# Patient Record
Sex: Female | Born: 1935 | Race: White | Hispanic: No | Smoking: Former smoker
Health system: Southern US, Community
[De-identification: ages and names within clinical notes are randomized; demographics above are authoritative.]

## PROBLEM LIST (undated history)

## (undated) DIAGNOSIS — G4730 Sleep apnea, unspecified: Secondary | ICD-10-CM

## (undated) DIAGNOSIS — E039 Hypothyroidism, unspecified: Secondary | ICD-10-CM

## (undated) DIAGNOSIS — E7800 Pure hypercholesterolemia, unspecified: Secondary | ICD-10-CM

## (undated) DIAGNOSIS — F419 Anxiety disorder, unspecified: Secondary | ICD-10-CM

## (undated) DIAGNOSIS — F32A Depression, unspecified: Secondary | ICD-10-CM

## (undated) DIAGNOSIS — C50919 Malignant neoplasm of unspecified site of unspecified female breast: Secondary | ICD-10-CM

## (undated) DIAGNOSIS — E119 Type 2 diabetes mellitus without complications: Secondary | ICD-10-CM

## (undated) DIAGNOSIS — I10 Essential (primary) hypertension: Secondary | ICD-10-CM

## (undated) DIAGNOSIS — F329 Major depressive disorder, single episode, unspecified: Secondary | ICD-10-CM

## (undated) DIAGNOSIS — M109 Gout, unspecified: Secondary | ICD-10-CM

## (undated) HISTORY — PX: EYE SURGERY: SHX253

## (undated) HISTORY — PX: BREAST SURGERY: SHX581

---

## 1998-03-21 ENCOUNTER — Emergency Department (HOSPITAL_COMMUNITY): Admission: EM | Admit: 1998-03-21 | Discharge: 1998-03-21 | Payer: Self-pay | Admitting: Emergency Medicine

## 2004-03-22 ENCOUNTER — Emergency Department: Payer: Self-pay | Admitting: Unknown Physician Specialty

## 2004-03-22 ENCOUNTER — Other Ambulatory Visit: Payer: Self-pay

## 2004-05-19 ENCOUNTER — Emergency Department: Payer: Self-pay | Admitting: General Practice

## 2004-05-19 ENCOUNTER — Other Ambulatory Visit: Payer: Self-pay

## 2004-06-22 ENCOUNTER — Inpatient Hospital Stay: Payer: Self-pay | Admitting: Unknown Physician Specialty

## 2004-06-28 ENCOUNTER — Ambulatory Visit: Payer: Self-pay | Admitting: Unknown Physician Specialty

## 2004-07-25 ENCOUNTER — Ambulatory Visit: Payer: Self-pay | Admitting: Unknown Physician Specialty

## 2004-08-25 ENCOUNTER — Ambulatory Visit: Payer: Self-pay | Admitting: Unknown Physician Specialty

## 2004-10-11 ENCOUNTER — Ambulatory Visit: Payer: Self-pay | Admitting: Internal Medicine

## 2004-10-21 ENCOUNTER — Ambulatory Visit: Payer: Self-pay | Admitting: Unknown Physician Specialty

## 2004-10-25 ENCOUNTER — Ambulatory Visit: Payer: Self-pay | Admitting: Unknown Physician Specialty

## 2004-11-25 ENCOUNTER — Ambulatory Visit: Payer: Self-pay | Admitting: Unknown Physician Specialty

## 2004-12-25 ENCOUNTER — Ambulatory Visit: Payer: Self-pay | Admitting: Unknown Physician Specialty

## 2005-01-25 ENCOUNTER — Ambulatory Visit: Payer: Self-pay | Admitting: Unknown Physician Specialty

## 2005-02-24 ENCOUNTER — Ambulatory Visit: Payer: Self-pay | Admitting: Unknown Physician Specialty

## 2005-03-27 ENCOUNTER — Ambulatory Visit: Payer: Self-pay | Admitting: Unknown Physician Specialty

## 2005-04-27 ENCOUNTER — Ambulatory Visit: Payer: Self-pay | Admitting: Unknown Physician Specialty

## 2005-05-25 ENCOUNTER — Ambulatory Visit: Payer: Self-pay | Admitting: Unknown Physician Specialty

## 2005-06-25 ENCOUNTER — Ambulatory Visit: Payer: Self-pay | Admitting: Unknown Physician Specialty

## 2005-07-25 ENCOUNTER — Ambulatory Visit: Payer: Self-pay | Admitting: Unknown Physician Specialty

## 2005-08-25 ENCOUNTER — Ambulatory Visit: Payer: Self-pay | Admitting: Unknown Physician Specialty

## 2005-09-24 ENCOUNTER — Ambulatory Visit: Payer: Self-pay | Admitting: Unknown Physician Specialty

## 2005-11-17 ENCOUNTER — Ambulatory Visit: Payer: Self-pay | Admitting: Unknown Physician Specialty

## 2005-11-25 ENCOUNTER — Ambulatory Visit: Payer: Self-pay | Admitting: Unknown Physician Specialty

## 2005-12-26 ENCOUNTER — Other Ambulatory Visit: Payer: Self-pay

## 2005-12-26 ENCOUNTER — Ambulatory Visit: Payer: Self-pay | Admitting: Obstetrics and Gynecology

## 2005-12-29 ENCOUNTER — Ambulatory Visit: Payer: Self-pay | Admitting: Unknown Physician Specialty

## 2006-01-04 ENCOUNTER — Ambulatory Visit: Payer: Self-pay | Admitting: Internal Medicine

## 2006-01-08 ENCOUNTER — Ambulatory Visit: Payer: Self-pay | Admitting: Obstetrics and Gynecology

## 2006-01-25 ENCOUNTER — Ambulatory Visit: Payer: Self-pay | Admitting: Unknown Physician Specialty

## 2006-02-24 ENCOUNTER — Ambulatory Visit: Payer: Self-pay | Admitting: Unknown Physician Specialty

## 2006-03-27 ENCOUNTER — Ambulatory Visit: Payer: Self-pay | Admitting: Unknown Physician Specialty

## 2006-04-27 ENCOUNTER — Ambulatory Visit: Payer: Self-pay | Admitting: Unknown Physician Specialty

## 2006-05-26 ENCOUNTER — Ambulatory Visit: Payer: Self-pay | Admitting: Unknown Physician Specialty

## 2006-06-26 ENCOUNTER — Ambulatory Visit: Payer: Self-pay | Admitting: Unknown Physician Specialty

## 2006-07-26 ENCOUNTER — Ambulatory Visit: Payer: Self-pay | Admitting: Unknown Physician Specialty

## 2006-08-26 ENCOUNTER — Ambulatory Visit: Payer: Self-pay | Admitting: Unknown Physician Specialty

## 2006-09-25 ENCOUNTER — Ambulatory Visit: Payer: Self-pay | Admitting: Unknown Physician Specialty

## 2006-10-26 ENCOUNTER — Ambulatory Visit: Payer: Self-pay | Admitting: Unknown Physician Specialty

## 2006-11-26 ENCOUNTER — Ambulatory Visit: Payer: Self-pay | Admitting: Unknown Physician Specialty

## 2006-12-26 ENCOUNTER — Ambulatory Visit: Payer: Self-pay | Admitting: Unknown Physician Specialty

## 2007-01-26 ENCOUNTER — Ambulatory Visit: Payer: Self-pay | Admitting: Unknown Physician Specialty

## 2007-03-29 ENCOUNTER — Ambulatory Visit: Payer: Self-pay | Admitting: Unknown Physician Specialty

## 2007-04-28 ENCOUNTER — Ambulatory Visit: Payer: Self-pay | Admitting: Unknown Physician Specialty

## 2007-05-07 ENCOUNTER — Ambulatory Visit: Payer: Self-pay | Admitting: Internal Medicine

## 2007-05-28 ENCOUNTER — Ambulatory Visit: Payer: Self-pay | Admitting: Unknown Physician Specialty

## 2007-06-26 ENCOUNTER — Ambulatory Visit: Payer: Self-pay | Admitting: Unknown Physician Specialty

## 2007-07-26 ENCOUNTER — Ambulatory Visit: Payer: Self-pay | Admitting: Unknown Physician Specialty

## 2007-08-26 ENCOUNTER — Ambulatory Visit: Payer: Self-pay | Admitting: Unknown Physician Specialty

## 2007-10-26 ENCOUNTER — Ambulatory Visit: Payer: Self-pay | Admitting: Unknown Physician Specialty

## 2008-01-26 ENCOUNTER — Ambulatory Visit: Payer: Self-pay | Admitting: Unknown Physician Specialty

## 2008-02-25 ENCOUNTER — Ambulatory Visit: Payer: Self-pay | Admitting: Unknown Physician Specialty

## 2008-03-27 ENCOUNTER — Ambulatory Visit: Payer: Self-pay | Admitting: Unknown Physician Specialty

## 2008-04-27 ENCOUNTER — Ambulatory Visit: Payer: Self-pay | Admitting: Unknown Physician Specialty

## 2008-05-25 ENCOUNTER — Ambulatory Visit: Payer: Self-pay | Admitting: Unknown Physician Specialty

## 2008-06-25 ENCOUNTER — Ambulatory Visit: Payer: Self-pay | Admitting: Unknown Physician Specialty

## 2008-08-25 ENCOUNTER — Ambulatory Visit: Payer: Self-pay | Admitting: Unknown Physician Specialty

## 2008-10-16 ENCOUNTER — Ambulatory Visit: Payer: Self-pay | Admitting: Internal Medicine

## 2008-10-23 ENCOUNTER — Ambulatory Visit: Payer: Self-pay | Admitting: Unknown Physician Specialty

## 2008-12-25 ENCOUNTER — Ambulatory Visit: Payer: Self-pay | Admitting: Unknown Physician Specialty

## 2009-02-24 ENCOUNTER — Ambulatory Visit: Payer: Self-pay | Admitting: Unknown Physician Specialty

## 2009-04-27 ENCOUNTER — Ambulatory Visit: Payer: Self-pay | Admitting: Unknown Physician Specialty

## 2009-07-27 ENCOUNTER — Ambulatory Visit: Payer: Self-pay | Admitting: Unknown Physician Specialty

## 2009-08-25 ENCOUNTER — Ambulatory Visit: Payer: Self-pay | Admitting: Unknown Physician Specialty

## 2009-10-28 ENCOUNTER — Ambulatory Visit: Payer: Self-pay | Admitting: Internal Medicine

## 2009-11-05 ENCOUNTER — Ambulatory Visit: Payer: Self-pay | Admitting: Unknown Physician Specialty

## 2009-12-27 ENCOUNTER — Ambulatory Visit: Payer: Self-pay | Admitting: Unknown Physician Specialty

## 2010-03-28 ENCOUNTER — Ambulatory Visit: Payer: Self-pay | Admitting: Unknown Physician Specialty

## 2010-04-27 ENCOUNTER — Ambulatory Visit: Payer: Self-pay | Admitting: Unknown Physician Specialty

## 2010-06-01 ENCOUNTER — Ambulatory Visit: Payer: Self-pay | Admitting: Obstetrics and Gynecology

## 2010-06-03 ENCOUNTER — Ambulatory Visit: Payer: Self-pay | Admitting: Obstetrics and Gynecology

## 2010-06-27 ENCOUNTER — Ambulatory Visit: Payer: Self-pay | Admitting: Unknown Physician Specialty

## 2010-09-26 ENCOUNTER — Ambulatory Visit: Payer: Self-pay | Admitting: Unknown Physician Specialty

## 2010-11-10 ENCOUNTER — Ambulatory Visit: Payer: Self-pay | Admitting: Internal Medicine

## 2010-11-26 ENCOUNTER — Ambulatory Visit: Payer: Self-pay | Admitting: Unknown Physician Specialty

## 2011-01-18 ENCOUNTER — Ambulatory Visit: Payer: Self-pay | Admitting: Urology

## 2011-01-20 LAB — PATHOLOGY REPORT

## 2011-02-27 ENCOUNTER — Ambulatory Visit: Payer: Self-pay | Admitting: Unknown Physician Specialty

## 2011-04-28 ENCOUNTER — Ambulatory Visit: Payer: Self-pay | Admitting: Unknown Physician Specialty

## 2011-05-26 ENCOUNTER — Ambulatory Visit: Payer: Self-pay | Admitting: Unknown Physician Specialty

## 2011-07-26 ENCOUNTER — Ambulatory Visit: Payer: Self-pay | Admitting: Unknown Physician Specialty

## 2011-10-27 ENCOUNTER — Ambulatory Visit: Payer: Self-pay | Admitting: Unknown Physician Specialty

## 2011-11-14 ENCOUNTER — Ambulatory Visit: Payer: Self-pay | Admitting: Internal Medicine

## 2011-12-26 ENCOUNTER — Ambulatory Visit: Payer: Self-pay | Admitting: Unknown Physician Specialty

## 2012-03-16 LAB — COMPREHENSIVE METABOLIC PANEL
Alkaline Phosphatase: 118 U/L (ref 50–136)
Anion Gap: 11 (ref 7–16)
BUN: 18 mg/dL (ref 7–18)
Bilirubin,Total: 0.9 mg/dL (ref 0.2–1.0)
Calcium, Total: 10 mg/dL (ref 8.5–10.1)
Chloride: 106 mmol/L (ref 98–107)
Co2: 20 mmol/L — ABNORMAL LOW (ref 21–32)
Creatinine: 0.93 mg/dL (ref 0.60–1.30)
EGFR (Non-African Amer.): 58 — ABNORMAL LOW
Osmolality: 275 (ref 275–301)
Potassium: 4 mmol/L (ref 3.5–5.1)
SGOT(AST): 38 U/L — ABNORMAL HIGH (ref 15–37)
SGPT (ALT): 32 U/L (ref 12–78)
Sodium: 137 mmol/L (ref 136–145)
Total Protein: 7.9 g/dL (ref 6.4–8.2)

## 2012-03-16 LAB — URINALYSIS, COMPLETE
Bilirubin,UR: NEGATIVE
Glucose,UR: NEGATIVE mg/dL (ref 0–75)
Nitrite: NEGATIVE
Specific Gravity: 1.019 (ref 1.003–1.030)
Squamous Epithelial: 1
WBC UR: 511 /HPF (ref 0–5)

## 2012-03-16 LAB — CBC
HCT: 46.3 % (ref 35.0–47.0)
HGB: 15.5 g/dL (ref 12.0–16.0)
MCHC: 33.4 g/dL (ref 32.0–36.0)
MCV: 99 fL (ref 80–100)
Platelet: 252 10*3/uL (ref 150–440)
RBC: 4.7 10*6/uL (ref 3.80–5.20)
WBC: 12.6 10*3/uL — ABNORMAL HIGH (ref 3.6–11.0)

## 2012-03-16 LAB — ACETAMINOPHEN LEVEL: Acetaminophen: <2 ug/mL

## 2012-03-16 LAB — ETHANOL
Ethanol %: <0.003 % (ref 0.000–0.080)
Ethanol: <3 mg/dL

## 2012-03-16 LAB — DRUG SCREEN, URINE
Amphetamines, Ur Screen: NEGATIVE (ref ?–1000)
Barbiturates, Ur Screen: NEGATIVE (ref ?–200)
Cannabinoid 50 Ng, Ur ~~LOC~~: NEGATIVE (ref ?–50)
Cocaine Metabolite,Ur ~~LOC~~: NEGATIVE (ref ?–300)
MDMA (Ecstasy)Ur Screen: POSITIVE (ref ?–500)
Opiate, Ur Screen: NEGATIVE (ref ?–300)
Phencyclidine (PCP) Ur S: NEGATIVE (ref ?–25)

## 2012-03-16 LAB — TSH: Thyroid Stimulating Horm: 2.09 u[IU]/mL

## 2012-03-17 ENCOUNTER — Inpatient Hospital Stay: Payer: Self-pay

## 2012-03-17 LAB — CBC WITH DIFFERENTIAL/PLATELET
Basophil #: 0 10*3/uL (ref 0.0–0.1)
Basophil %: 0.4 %
Eosinophil #: 0.1 10*3/uL (ref 0.0–0.7)
Eosinophil %: 1.2 %
HCT: 45 % (ref 35.0–47.0)
HGB: 14.8 g/dL (ref 12.0–16.0)
Lymphocyte #: 2.9 10*3/uL (ref 1.0–3.6)
Lymphocyte %: 26.8 %
MCH: 32.1 pg (ref 26.0–34.0)
Neutrophil #: 6.9 10*3/uL — ABNORMAL HIGH (ref 1.4–6.5)
RBC: 4.6 10*6/uL (ref 3.80–5.20)
RDW: 14.6 % — ABNORMAL HIGH (ref 11.5–14.5)
WBC: 10.7 10*3/uL (ref 3.6–11.0)

## 2012-03-17 LAB — BASIC METABOLIC PANEL
Anion Gap: 6 — ABNORMAL LOW (ref 7–16)
Calcium, Total: 9.8 mg/dL (ref 8.5–10.1)
Co2: 26 mmol/L (ref 21–32)
EGFR (Non-African Amer.): 49 — ABNORMAL LOW
Potassium: 3.6 mmol/L (ref 3.5–5.1)
Sodium: 138 mmol/L (ref 136–145)

## 2012-03-18 LAB — BASIC METABOLIC PANEL
Calcium, Total: 9.2 mg/dL (ref 8.5–10.1)
Chloride: 103 mmol/L (ref 98–107)
Osmolality: 272 (ref 275–301)
Potassium: 3.3 mmol/L — ABNORMAL LOW (ref 3.5–5.1)

## 2012-03-28 ENCOUNTER — Ambulatory Visit: Payer: Self-pay | Admitting: Psychiatry

## 2012-04-29 ENCOUNTER — Ambulatory Visit: Payer: Self-pay | Admitting: Psychiatry

## 2012-05-26 ENCOUNTER — Ambulatory Visit: Payer: Self-pay | Admitting: Psychiatry

## 2012-06-26 ENCOUNTER — Ambulatory Visit: Payer: Self-pay | Admitting: Psychiatry

## 2012-07-25 ENCOUNTER — Ambulatory Visit: Payer: Self-pay | Admitting: Psychiatry

## 2012-08-25 ENCOUNTER — Ambulatory Visit: Payer: Self-pay | Admitting: Psychiatry

## 2012-09-24 ENCOUNTER — Ambulatory Visit: Payer: Self-pay | Admitting: Psychiatry

## 2012-10-25 ENCOUNTER — Ambulatory Visit: Payer: Self-pay | Admitting: Psychiatry

## 2012-12-25 ENCOUNTER — Ambulatory Visit: Payer: Self-pay | Admitting: Psychiatry

## 2013-01-25 ENCOUNTER — Ambulatory Visit: Payer: Self-pay | Admitting: Psychiatry

## 2013-01-26 ENCOUNTER — Ambulatory Visit: Payer: Self-pay | Admitting: Psychiatry

## 2013-01-31 ENCOUNTER — Inpatient Hospital Stay: Payer: Self-pay

## 2013-01-31 ENCOUNTER — Other Ambulatory Visit: Payer: Self-pay

## 2013-01-31 LAB — URINALYSIS, COMPLETE
Bilirubin,UR: NEGATIVE
Glucose,UR: NEGATIVE mg/dL (ref 0–75)
Hyaline Cast: 20
Ph: 5 (ref 4.5–8.0)
Protein: 30
RBC,UR: 149 /HPF (ref 0–5)
Squamous Epithelial: 26
WBC UR: 243 /HPF (ref 0–5)

## 2013-01-31 LAB — CREATININE, SERUM
Creatinine: 0.97 mg/dL (ref 0.60–1.30)
EGFR (African American): >60

## 2013-01-31 LAB — PLATELET COUNT: Platelet: 273 10*3/uL (ref 150–440)

## 2013-02-01 LAB — CBC WITH DIFFERENTIAL/PLATELET
Basophil %: 0.5 %
Eosinophil #: 0.2 10*3/uL (ref 0.0–0.7)
Eosinophil %: 1.9 %
HCT: 43.3 % (ref 35.0–47.0)
HGB: 14.4 g/dL (ref 12.0–16.0)
Lymphocyte %: 33.8 %
MCHC: 33.2 g/dL (ref 32.0–36.0)
MCV: 92 fL (ref 80–100)
Monocyte #: 0.8 x10 3/mm (ref 0.2–0.9)
Neutrophil #: 6.5 10*3/uL (ref 1.4–6.5)
Neutrophil %: 56.9 %
Platelet: 255 10*3/uL (ref 150–440)
RBC: 4.73 10*6/uL (ref 3.80–5.20)
WBC: 11.3 10*3/uL — ABNORMAL HIGH (ref 3.6–11.0)

## 2013-02-01 LAB — BASIC METABOLIC PANEL
BUN: 15 mg/dL (ref 7–18)
Chloride: 105 mmol/L (ref 98–107)
Co2: 24 mmol/L (ref 21–32)
EGFR (African American): >60
EGFR (Non-African Amer.): >60
Potassium: 3.7 mmol/L (ref 3.5–5.1)
Sodium: 136 mmol/L (ref 136–145)

## 2013-02-02 LAB — URINE CULTURE

## 2013-02-02 LAB — BASIC METABOLIC PANEL
Anion Gap: 4 — ABNORMAL LOW (ref 7–16)
Chloride: 105 mmol/L (ref 98–107)
EGFR (Non-African Amer.): 58 — ABNORMAL LOW
Glucose: 84 mg/dL (ref 65–99)
Potassium: 3.8 mmol/L (ref 3.5–5.1)
Sodium: 135 mmol/L — ABNORMAL LOW (ref 136–145)

## 2013-02-02 LAB — CBC WITH DIFFERENTIAL/PLATELET
Basophil #: 0.1 10*3/uL (ref 0.0–0.1)
Eosinophil #: 0.3 10*3/uL (ref 0.0–0.7)
HCT: 39.9 % (ref 35.0–47.0)
Lymphocyte #: 2.7 10*3/uL (ref 1.0–3.6)
Lymphocyte %: 28.2 %
MCHC: 34.4 g/dL (ref 32.0–36.0)
MCV: 91 fL (ref 80–100)
Monocyte #: 0.7 x10 3/mm (ref 0.2–0.9)
Neutrophil %: 60.6 %
Platelet: 235 10*3/uL (ref 150–440)
RBC: 4.39 10*6/uL (ref 3.80–5.20)
RDW: 14.4 % (ref 11.5–14.5)
WBC: 9.5 10*3/uL (ref 3.6–11.0)

## 2013-02-04 ENCOUNTER — Inpatient Hospital Stay: Payer: Self-pay | Admitting: Psychiatry

## 2013-02-05 LAB — BEHAVIORAL MEDICINE 1 PANEL
Albumin: 3 g/dL — ABNORMAL LOW (ref 3.4–5.0)
Alkaline Phosphatase: 431 U/L — ABNORMAL HIGH (ref 50–136)
Anion Gap: 4 — ABNORMAL LOW (ref 7–16)
BUN: 12 mg/dL (ref 7–18)
Basophil %: 0.5 %
Bilirubin,Total: 0.4 mg/dL (ref 0.2–1.0)
Chloride: 106 mmol/L (ref 98–107)
Co2: 27 mmol/L (ref 21–32)
Creatinine: 0.93 mg/dL (ref 0.60–1.30)
EGFR (Non-African Amer.): 58 — ABNORMAL LOW
Eosinophil #: 0.1 10*3/uL (ref 0.0–0.7)
Eosinophil %: 1.3 %
Glucose: 96 mg/dL (ref 65–99)
Lymphocyte %: 37.1 %
MCH: 30.7 pg (ref 26.0–34.0)
MCHC: 33.7 g/dL (ref 32.0–36.0)
MCV: 91 fL (ref 80–100)
Monocyte #: 0.7 x10 3/mm (ref 0.2–0.9)
Neutrophil #: 5.2 10*3/uL (ref 1.4–6.5)
Neutrophil %: 53.4 %
Osmolality: 273 (ref 275–301)
Potassium: 4.3 mmol/L (ref 3.5–5.1)
RDW: 14.7 % — ABNORMAL HIGH (ref 11.5–14.5)
SGOT(AST): 61 U/L — ABNORMAL HIGH (ref 15–37)
Thyroid Stimulating Horm: 0.373 u[IU]/mL — ABNORMAL LOW
Total Protein: 7.2 g/dL (ref 6.4–8.2)
WBC: 9.7 10*3/uL (ref 3.6–11.0)

## 2013-02-05 LAB — CULTURE, BLOOD (SINGLE)

## 2013-02-06 LAB — CULTURE, BLOOD (SINGLE)

## 2013-02-11 LAB — URINALYSIS, COMPLETE
Blood: NEGATIVE
Glucose,UR: NEGATIVE mg/dL (ref 0–75)
Ketone: NEGATIVE
Leukocyte Esterase: NEGATIVE
Nitrite: NEGATIVE
Ph: 5 (ref 4.5–8.0)
RBC,UR: 1 /HPF (ref 0–5)
Specific Gravity: 1.025 (ref 1.003–1.030)
Squamous Epithelial: 1

## 2013-02-24 ENCOUNTER — Ambulatory Visit: Payer: Self-pay | Admitting: Psychiatry

## 2013-03-27 ENCOUNTER — Ambulatory Visit: Payer: Self-pay | Admitting: Psychiatry

## 2013-03-27 DIAGNOSIS — C50919 Malignant neoplasm of unspecified site of unspecified female breast: Secondary | ICD-10-CM

## 2013-03-27 HISTORY — DX: Malignant neoplasm of unspecified site of unspecified female breast: C50.919

## 2013-04-08 ENCOUNTER — Ambulatory Visit: Payer: Self-pay

## 2013-04-14 ENCOUNTER — Ambulatory Visit: Payer: Self-pay

## 2013-04-28 ENCOUNTER — Ambulatory Visit: Payer: Self-pay | Admitting: Psychiatry

## 2013-05-02 ENCOUNTER — Ambulatory Visit: Payer: Self-pay | Admitting: Surgery

## 2013-05-08 LAB — PATHOLOGY REPORT

## 2013-05-12 ENCOUNTER — Ambulatory Visit: Payer: Self-pay | Admitting: Surgery

## 2013-05-15 ENCOUNTER — Ambulatory Visit: Payer: Self-pay | Admitting: Oncology

## 2013-05-16 ENCOUNTER — Ambulatory Visit: Payer: Self-pay | Admitting: Surgery

## 2013-05-17 ENCOUNTER — Ambulatory Visit: Payer: Self-pay | Admitting: Oncology

## 2013-05-20 LAB — PATHOLOGY REPORT

## 2013-05-25 ENCOUNTER — Ambulatory Visit: Payer: Self-pay | Admitting: Oncology

## 2013-05-25 ENCOUNTER — Ambulatory Visit: Payer: Self-pay | Admitting: Psychiatry

## 2013-05-27 ENCOUNTER — Ambulatory Visit: Payer: Self-pay | Admitting: Oncology

## 2013-06-16 DIAGNOSIS — E039 Hypothyroidism, unspecified: Secondary | ICD-10-CM | POA: Insufficient documentation

## 2013-06-16 DIAGNOSIS — E785 Hyperlipidemia, unspecified: Secondary | ICD-10-CM | POA: Insufficient documentation

## 2013-06-16 DIAGNOSIS — I10 Essential (primary) hypertension: Secondary | ICD-10-CM | POA: Insufficient documentation

## 2013-06-16 DIAGNOSIS — E119 Type 2 diabetes mellitus without complications: Secondary | ICD-10-CM | POA: Insufficient documentation

## 2013-06-25 ENCOUNTER — Ambulatory Visit: Payer: Self-pay | Admitting: Psychiatry

## 2013-06-25 ENCOUNTER — Ambulatory Visit: Payer: Self-pay | Admitting: Oncology

## 2013-06-30 LAB — CBC CANCER CENTER
BASOS ABS: 0.2 x10 3/mm — AB (ref 0.0–0.1)
Basophil %: 1.4 %
EOS PCT: 2.1 %
Eosinophil #: 0.3 x10 3/mm (ref 0.0–0.7)
HCT: 46.4 % (ref 35.0–47.0)
HGB: 14.5 g/dL (ref 12.0–16.0)
Lymphocyte #: 5.4 x10 3/mm — ABNORMAL HIGH (ref 1.0–3.6)
Lymphocyte %: 37.2 %
MCH: 29 pg (ref 26.0–34.0)
MCHC: 31.4 g/dL — AB (ref 32.0–36.0)
MCV: 93 fL (ref 80–100)
Monocyte #: 1 x10 3/mm — ABNORMAL HIGH (ref 0.2–0.9)
Monocyte %: 7.1 %
Neutrophil #: 7.6 x10 3/mm — ABNORMAL HIGH (ref 1.4–6.5)
Neutrophil %: 52.2 %
PLATELETS: 245 x10 3/mm (ref 150–440)
RBC: 5.01 10*6/uL (ref 3.80–5.20)
RDW: 15.1 % — ABNORMAL HIGH (ref 11.5–14.5)
WBC: 14.5 x10 3/mm — AB (ref 3.6–11.0)

## 2013-07-07 LAB — CBC CANCER CENTER
BASOS ABS: 0.1 x10 3/mm (ref 0.0–0.1)
BASOS PCT: 0.7 %
EOS ABS: 0.2 x10 3/mm (ref 0.0–0.7)
Eosinophil %: 1.7 %
HCT: 44.3 % (ref 35.0–47.0)
HGB: 13.9 g/dL (ref 12.0–16.0)
Lymphocyte #: 3.4 x10 3/mm (ref 1.0–3.6)
Lymphocyte %: 30.3 %
MCH: 29 pg (ref 26.0–34.0)
MCHC: 31.4 g/dL — ABNORMAL LOW (ref 32.0–36.0)
MCV: 92 fL (ref 80–100)
Monocyte #: 0.8 x10 3/mm (ref 0.2–0.9)
Monocyte %: 7.4 %
Neutrophil #: 6.8 x10 3/mm — ABNORMAL HIGH (ref 1.4–6.5)
Neutrophil %: 59.9 %
PLATELETS: 215 x10 3/mm (ref 150–440)
RBC: 4.8 10*6/uL (ref 3.80–5.20)
RDW: 14.9 % — AB (ref 11.5–14.5)
WBC: 11.3 x10 3/mm — AB (ref 3.6–11.0)

## 2013-07-25 ENCOUNTER — Ambulatory Visit: Payer: Self-pay | Admitting: Oncology

## 2013-08-01 ENCOUNTER — Ambulatory Visit: Payer: Self-pay | Admitting: Psychiatry

## 2013-08-29 ENCOUNTER — Ambulatory Visit: Payer: Self-pay | Admitting: Oncology

## 2013-09-01 ENCOUNTER — Ambulatory Visit: Payer: Self-pay | Admitting: Oncology

## 2013-09-01 LAB — COMPREHENSIVE METABOLIC PANEL
ALK PHOS: 129 U/L — AB
AST: 16 U/L (ref 15–37)
Albumin: 3.4 g/dL (ref 3.4–5.0)
Anion Gap: 12 (ref 7–16)
BUN: 16 mg/dL (ref 7–18)
Bilirubin,Total: 0.3 mg/dL (ref 0.2–1.0)
CALCIUM: 10.1 mg/dL (ref 8.5–10.1)
CHLORIDE: 100 mmol/L (ref 98–107)
Co2: 25 mmol/L (ref 21–32)
Creatinine: 1.14 mg/dL (ref 0.60–1.30)
EGFR (African American): 52 — ABNORMAL LOW
EGFR (Non-African Amer.): 45 — ABNORMAL LOW
Glucose: 131 mg/dL — ABNORMAL HIGH (ref 65–99)
Osmolality: 277 (ref 275–301)
Potassium: 4 mmol/L (ref 3.5–5.1)
SGPT (ALT): 24 U/L (ref 12–78)
SODIUM: 137 mmol/L (ref 136–145)
Total Protein: 8.3 g/dL — ABNORMAL HIGH (ref 6.4–8.2)

## 2013-09-01 LAB — CBC CANCER CENTER
BASOS ABS: 0.1 x10 3/mm (ref 0.0–0.1)
Basophil %: 0.8 %
Eosinophil #: 0.1 x10 3/mm (ref 0.0–0.7)
Eosinophil %: 1.5 %
HCT: 47 % (ref 35.0–47.0)
HGB: 15.2 g/dL (ref 12.0–16.0)
Lymphocyte #: 2.5 x10 3/mm (ref 1.0–3.6)
Lymphocyte %: 27.9 %
MCH: 30.6 pg (ref 26.0–34.0)
MCHC: 32.3 g/dL (ref 32.0–36.0)
MCV: 95 fL (ref 80–100)
MONO ABS: 0.7 x10 3/mm (ref 0.2–0.9)
Monocyte %: 7.3 %
NEUTROS ABS: 5.6 x10 3/mm (ref 1.4–6.5)
NEUTROS PCT: 62.5 %
PLATELETS: 208 x10 3/mm (ref 150–440)
RBC: 4.96 10*6/uL (ref 3.80–5.20)
RDW: 16.3 % — AB (ref 11.5–14.5)
WBC: 9 x10 3/mm (ref 3.6–11.0)

## 2013-09-03 ENCOUNTER — Ambulatory Visit: Payer: Self-pay | Admitting: Psychiatry

## 2013-09-24 ENCOUNTER — Ambulatory Visit: Payer: Self-pay | Admitting: Oncology

## 2013-10-08 ENCOUNTER — Ambulatory Visit: Payer: Self-pay | Admitting: Psychiatry

## 2013-10-25 ENCOUNTER — Ambulatory Visit: Payer: Self-pay | Admitting: Oncology

## 2013-10-25 ENCOUNTER — Ambulatory Visit: Payer: Self-pay | Admitting: Psychiatry

## 2013-10-29 DIAGNOSIS — F32A Depression, unspecified: Secondary | ICD-10-CM | POA: Insufficient documentation

## 2013-10-29 DIAGNOSIS — F329 Major depressive disorder, single episode, unspecified: Secondary | ICD-10-CM | POA: Insufficient documentation

## 2013-11-25 ENCOUNTER — Ambulatory Visit: Payer: Self-pay | Admitting: Psychiatry

## 2013-12-26 ENCOUNTER — Ambulatory Visit: Payer: Self-pay | Admitting: Psychiatry

## 2014-01-26 ENCOUNTER — Ambulatory Visit: Payer: Self-pay | Admitting: Psychiatry

## 2014-02-22 IMAGING — US US BREAST*R* LIMITED INC AXILLA
1 series · 5 of 5 positions shown · non-contrast
Comparison: With priors.

CLINICAL DATA: Abnormal right screening mammogram.

EXAM:
DIGITAL DIAGNOSTIC  RIGHT MAMMOGRAM WITH CAD
ULTRASOUND RIGHT BREAST

[Series 1: us breast*right* limited inc axilla · 0.08mm/px · 5 of 5 slices shown]
[im 1/5]
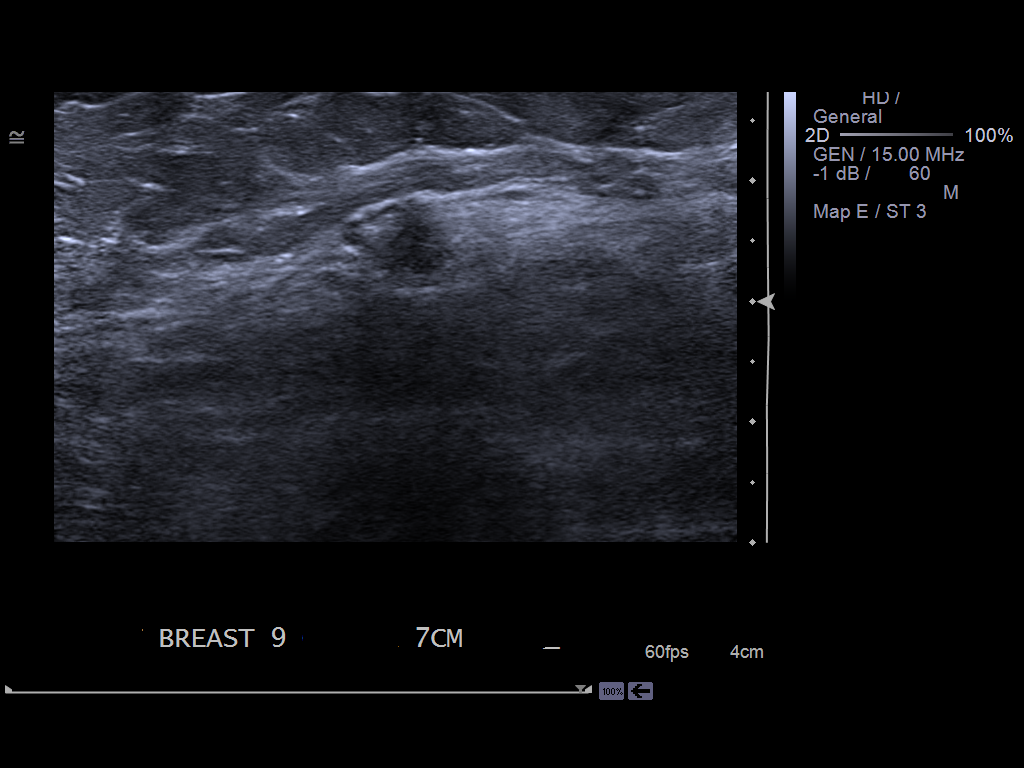
[im 2/5]
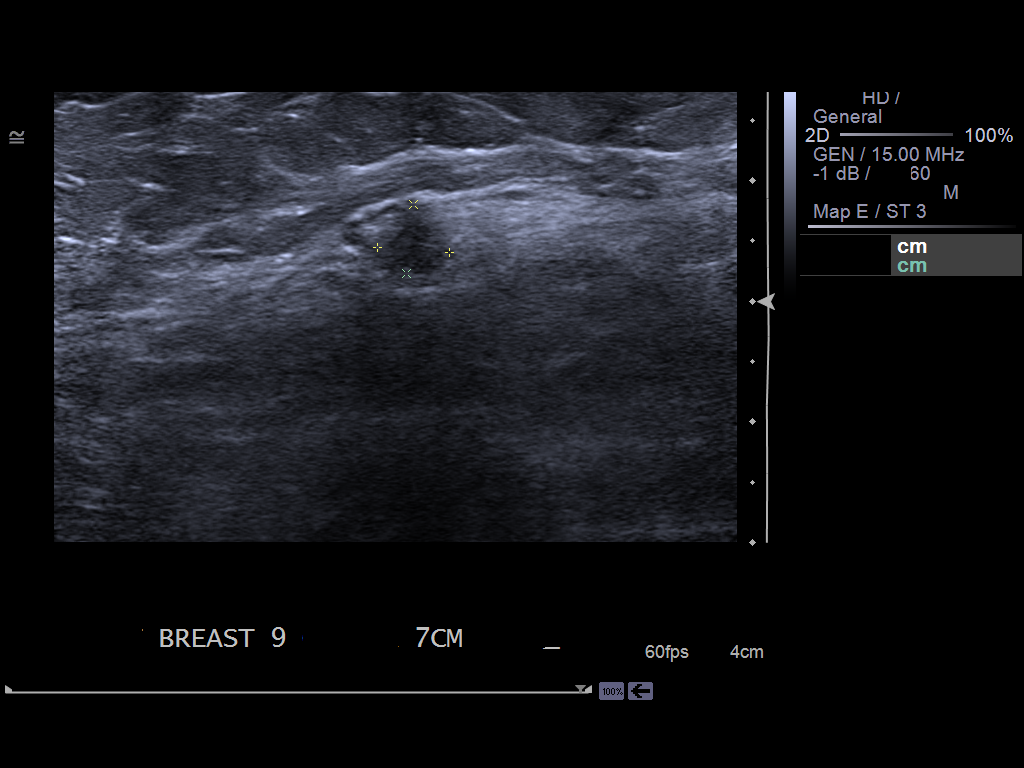
[im 3/5]
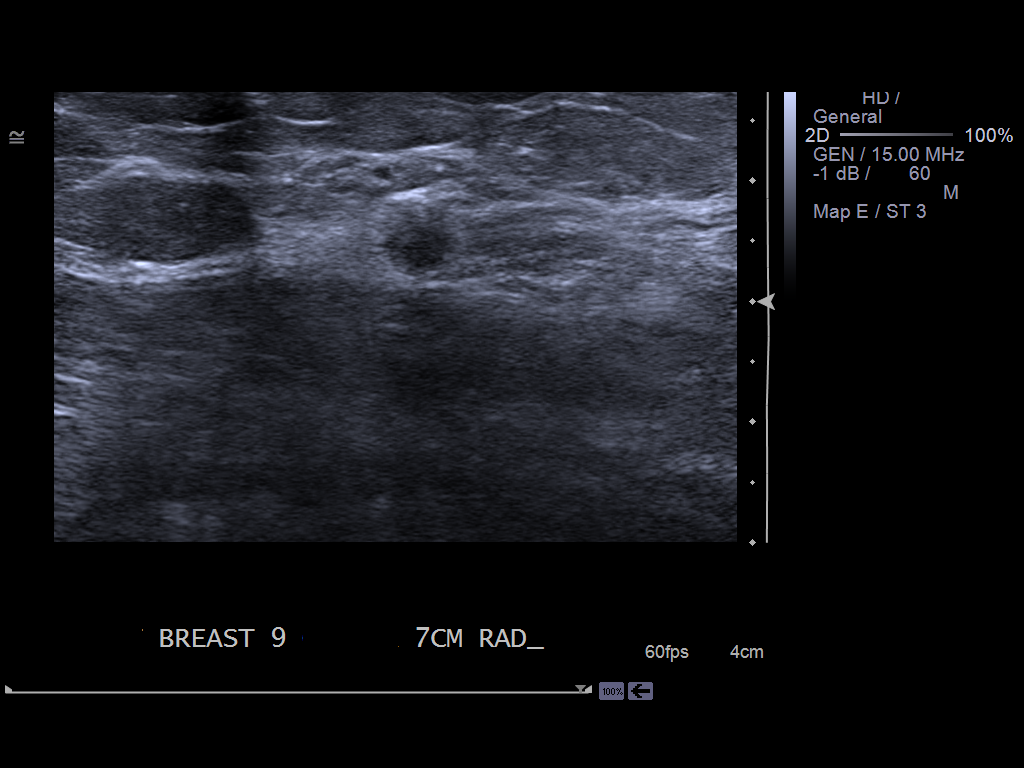
[im 4/5]
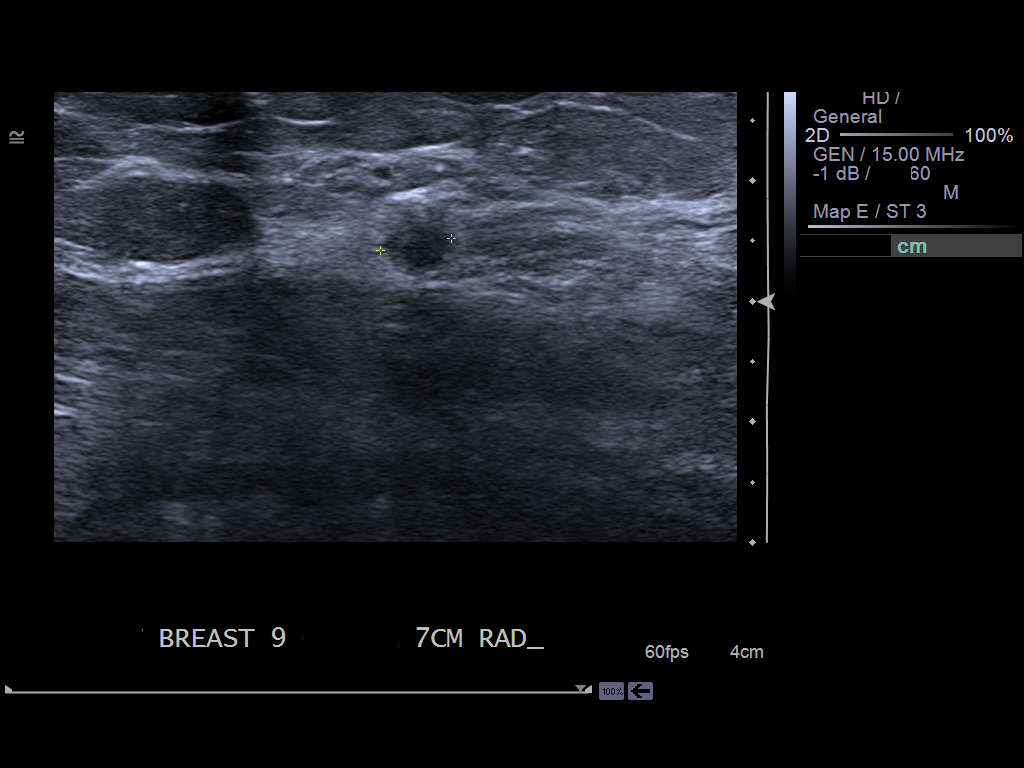
[im 5/5]
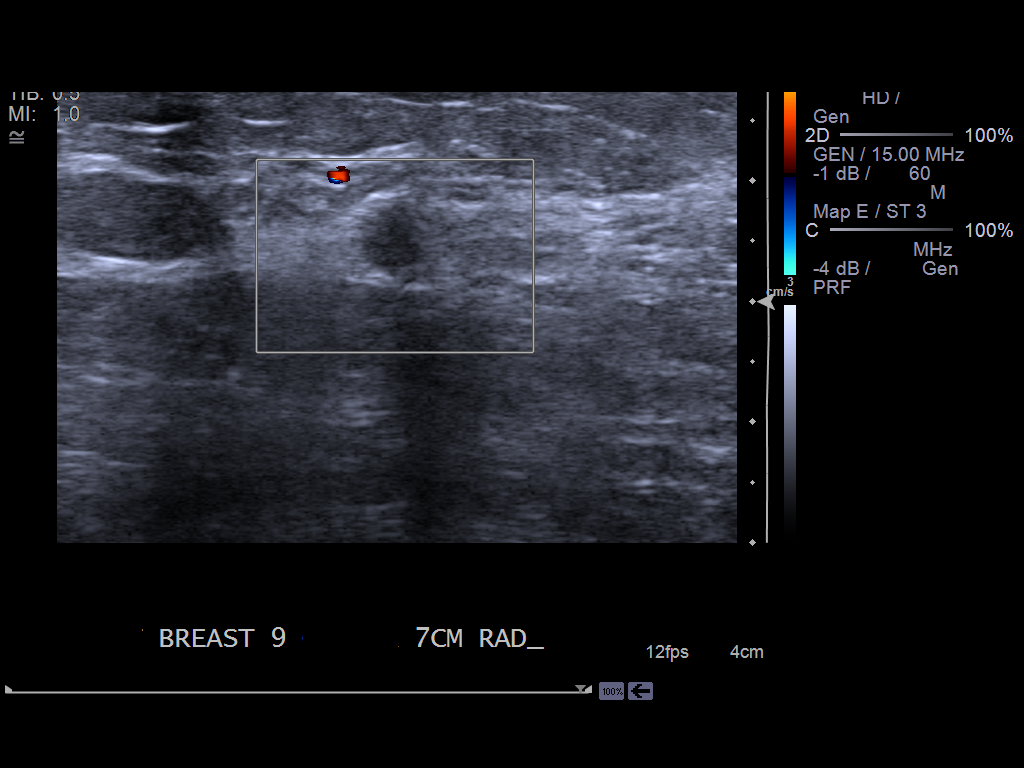

[5 of 5 positions shown; findings below may reference images not displayed]

ACR Breast Density Category b: There are scattered areas of
fibroglandular density.
FINDINGS: Additional imaging of the right breast was obtained. In the lateral
aspect of the breast there is persistence of a 7 mm nodule with
obscured margins. There are no associated malignant type
microcalcifications.

Mammographic images were processed with CAD.

On physical exam, I do not palpate a mass in the right breast..

Ultrasound is performed, showing there is a 6 x 6 x 6 mm hypoechoic
mass with indistinct borders in the right breast at 9 o'clock 7 cm
from the nipple. Sonographic evaluation of the right axilla does not
show any enlarged adenopathy.
IMPRESSION: Suspicious right breast mass.  Tissue sampling is recommended.

RECOMMENDATION:
Ultrasound-guided core biopsy of the right breast is recommended.
This will be scheduled at the patient's convenience.

I have discussed the findings and recommendations with the patient.
Results were also provided in writing at the conclusion of the
visit.

BI-RADS CATEGORY  4: Suspicious abnormality - biopsy should be
considered.

## 2014-02-24 ENCOUNTER — Ambulatory Visit: Payer: Self-pay | Admitting: Psychiatry

## 2014-03-30 ENCOUNTER — Ambulatory Visit: Payer: Self-pay | Admitting: Psychiatry

## 2014-04-27 ENCOUNTER — Ambulatory Visit: Payer: Self-pay | Admitting: *Deleted

## 2014-06-03 ENCOUNTER — Ambulatory Visit
Admit: 2014-06-03 | Disposition: A | Discharge status: Home / Self Care | Payer: Self-pay | Attending: Oncology | Admitting: Oncology

## 2014-06-26 ENCOUNTER — Ambulatory Visit
Admit: 2014-06-26 | Disposition: A | Discharge status: Home / Self Care | Payer: Self-pay | Attending: Oncology | Admitting: Oncology

## 2014-06-26 ENCOUNTER — Ambulatory Visit
Admit: 2014-06-26 | Disposition: A | Discharge status: Home / Self Care | Payer: Self-pay | Attending: Psychiatry | Admitting: Psychiatry

## 2014-07-14 NOTE — Consult Note (Signed)
PATIENT NAME:  Allison Gonzalez, Allison Gonzalez MR#:  [id number] DATE OF BIRTH:  1929-12-10  DATE OF CONSULTATION:  03/16/2012  REFERRING PHYSICIAN:   CONSULTING PHYSICIAN:  Iyonnah Ferrante K. Karysa Heft, MD  REASON FOR CONSULTATION:  Staff reports that the patient has been having anxiety and medications calm down.    HISTORY OF PRESENT ILLNESS:  The patient is an 81 year old white female who is married and lives with her husband, who is in his 35s.  They have 6 children that visit them often.  The patient's chief complaint is, "I have depression.  I was being followed by Dr. Thurmond Butts for depression and now I am going to Dr. Ola Spurr."  The patient reports that she has long history of depression and anxiety, and was being followed by Dr. Thurmond Butts and currently she is being followed by Dr. Ola Spurr, and last appointment cannot remember, next appointment is to be made.  Alcohol and drugs denied.  Staff reports that the patient's oxygen is 100% and her pulse rate is 95 per minute, regular.    MENTAL STATUS EXAMINATION:  The patient is seen laying in bed in hospital clothes.  Alert but has breathing problem and is breathing heavy and constantly complained of breathing problem and wants to be helped for the same Anxiety is secondary to breathing problem.  Even after confrontation, she reports that she is aware of breathing heavy and needs help with the same.  Admits to feeling depressed, admits to hopelessness, helpless, worthless and useless.  Denies suicide or homicidal plan.  Cognition is intact.  She knew [place], [place] of [place], name of the current president.  Denies any paranoid or suspicious ideas.  She is preoccupied about breathing problems which are causing her anxiety and restlessness.    IMPRESSION:  Major depressive disorder, recurrent, severe.  Generalized anxiety disorder.  RECOMMENDATIONS:  Continue current medications and p.r.n. medications.  Hopefully, the p.r.n. medications will help control her  anxiety which will help control her constant complaint about breathing issues.      ____________________________ Wallace Cullens. Franchot Mimes, MD skc:cs D: 03/16/2012 21:49:00 ET T: 03/17/2012 21:08:44 ET JOB#: [id number]  cc: Arlyn Leak K. Franchot Mimes, MD, <Dictator> Dewain Penning MD ELECTRONICALLY SIGNED 03/17/2012 21:34

## 2014-07-14 NOTE — Consult Note (Signed)
DATE OF BIRTH:  1929/04/14  DATE OF CONSULTATION:  03/20/2012  CONSULTING PHYSICIAN:  Angelis Gates K. Shaterra Sanzone, MD  The patient was seen in consultation in room #109. This is a followup consultation as requested. The patient is seen with her husband with her 2 sons. The sons are informed about the patient. Today, the patient is seen lying quietly in bed in hospital clothes, does not appear to be anxious.   OBJECTIVE:  The patient is seen lying in bed, not as anxious, pleasant and cooperative, talking with her husband, talking with her family, and she is glad that they are visiting her, and very pleasant and cooperative. No agitation, no anxiety noted. Does not appear to be overtly depressed, though she does continue to have longstanding mild depression. No evidence of psychosis. Absolutely denies any ideas or plans to hurt herself or others. She does have chronic anxiety, for which she gets help on an outpatient basis from her psychiatrist of many years.  IMPRESSION: 1.  Generalized anxiety disorder.  2.  Major depressive disorder, recurrent.  RECOMMENDATIONS:  The patient can be discharged after medically stable with a followup appointment with her psychiatrist in the community, with whom she relates very well and has been going to her for many years, as she refuses to come to see any of our psychiatrists here, as she relates to that psychiatrist very well. There is no need for the patient to come to behavioral health at this time, as she does not have any problems that are acute enough that need admission to an inpatient psychiatric facility. Family was explained about the same.     ____________________________ Wallace Cullens. Franchot Mimes, MD skc:ms D: 03/20/2012 18:45:14 ET T: 03/20/2012 20:16:44 ET JOB#: [id number]  cc: Arlyn Leak K. Franchot Mimes, MD, <Dictator> Dewain Penning MD ELECTRONICALLY SIGNED 03/21/2012 20:37

## 2014-07-14 NOTE — Discharge Summary (Signed)
PATIENT NAME:  Allison Gonzalez, Allison Gonzalez MR#:  [id number] DATE OF BIRTH:  05/05/1929  DATE OF ADMISSION:  03/17/2012 DATE OF DISCHARGE:  03/21/2012  DISCHARGE DIAGNOSES: 1. Urinary tract infection.  2. Altered mental status.  3. Severe depression.  4. Hypertension.  5. Debility.  6. Hypothyroidism.   HISTORY OF PRESENT ILLNESS: Please see initial history and physical for full details. Basically, this is an 81 year old woman with long history of psychiatric illness who was previously followed by Dr. Thurmond Butts in psychiatry. She was doing relatively well at home, had been getting some home physical therapy, but then developed altered mental status. She was found to have a urinary tract infection. She was admitted and treated with levofloxacin for this.   HOSPITAL COURSE BY ISSUE:  1. UTI. Urine culture was mixed. UA was positive. She was treated with levofloxacin and this resolved.  2. Altered mental status. Likely due to urinary tract infection as well as underlying psychiatric illness.  3. Depression. The patient was continued on her outpatient Wellbutrin and Prozac. She was seen by psychiatry, inpatient, who felt there was no need for behavioral health admission. She is to follow up with Dr. Weber Cooks in psychiatry.  4. Hypertension. She has been stable and we have put her back on her Losartan, but have held her diuretic. She had had atenolol in the past, but this was discontinued due to bradycardia in the past.  5. Hypothyroidism. Her TSH had been quite high when she was an outpatient as she had missed three months of her medication. It has now stabilized on her current dose.  6. Debility. The patient is very weak and can hardly get out of bed. She was seen by physical therapy who recommended rehab. Family also states they are unable to care for her at home. The patient will be discharged to a rehab facility, short-term. She will likely need less than 30 days. Goal is to have her return home with her husband.    The patient's family was quite disruptive at points. Her husband has a very high, loud deep voice that he says people always thinks he is yelling. We had long discussions with him and he seemed relatively okay with her treatment. Her son was also there and was asking, "what goes on in this pace, nobody calls me."  The patient seems to be effected by these interactions with her family and whenever they were there had a very, very flat affect. I suspect some of her underlying depression is situational.   DISCHARGE MEDICATIONS: 1. Klor-Con 20 milliequivalents, one a day. 2. Colace 100 mg once a day.  3. Allopurinol 300 mg once a day.  4. Levothyroxine 175 mg once a day.  5. Aspirin 81 mg once a day.  6. Losartan 25 mg once a day.  7. Wellbutrin 150 mg 1 tablet once a day.  8. Prozac 40 mg once a day.  9. Atorvastatin 10 mg once a day.  10. Levofloxacin 500 mg once a day to continue for 3 more days.  11. Norco 5/325 mg 1 tablet every 6 hours as needed p.r.n. pain.   DISCHARGE INSTRUCTIONS: The patient will be discharged to a skilled nursing facility for rehab. She is to call if there are any new or recurrent symptoms.   DISCHARGE DIET: Low sodium, regular consistency.   ACTIVITY LIMITATIONS: As tolerated.   DISCHARGE FOLLOW-UP: The patient will follow up with Dr. Ola Spurr within 1 to 2 weeks. She will also arrange followup with Dr.  Clapacs, her outpatient psychiatrist.  TIME SPENT: This discharge took 35 minutes. ____________________________ Cheral Marker. Ola Spurr, MD dpf:sb D: 03/21/2012 08:54:12 ET     T: 03/21/2012 09:06:28 ET       JOB#: [id number] cc: Cheral Marker. Ola Spurr, MD, <Dictator> DAVID Ola Spurr MD ELECTRONICALLY SIGNED 03/26/2012 21:54

## 2014-07-17 NOTE — Consult Note (Signed)
PATIENT NAME:  Allison Gonzalez, OHLSON MR#:  [id number] DATE OF BIRTH:  Aug 14, 1929  DATE OF CONSULTATION:  01/31/2013  REFERRING PHYSICIAN:  Cheral Marker. Ola Spurr, MD CONSULTING PHYSICIAN:  Gonzella Lex, MD  IDENTIFYING INFORMATION AND REASON FOR CONSULT: An 81 year old woman with a history of recurrent depression, admitted to the hospital for recent decline in functioning. Consultation for possible depression.   HISTORY OF PRESENT ILLNESS: The patient was admitted to the hospital by her primary care doctor. The patient's son had apparently brought her in, reporting that for the past two or three weeks, she has been more withdrawn and less active. It is mentioned a couple of times that she "stares into space" and is not interacting with the family as much. There is concern that she is getting more depressed. On interview today, the patient tells me that she does not think she has been feeling her normal self for the last couple of weeks. She has been feeling more down and fatigued. She is ambivalent about whether she is actually feeling sad or depressed. She admits that she has not been getting up and doing very much. She blames part of that weakness, part of that on pain that she has been having since having some falls at home. Denies that there has been a change in her sleep schedule or appetite. Denies suicidal ideation. Denies hallucinations. Says she is still taking her medicine the same as usual. She recently had her husband get sicker and then come home from the hospital, and she is more worried about him.   PAST PSYCHIATRIC HISTORY: The patient has been getting ECT maintenance for several years now. Started getting, it with Dr. Thurmond Butts, and I am currently seeing her for maintenance ECT. She has a history of several recurrent major depressions that did not respond well to medication  alone. No history of suicidal behavior. Positive history of hospitalizations in the past. Of late, she has been maintained on  Prozac primarily, along with maintenance ECT.   SOCIAL HISTORY: The patient lives with her husband, who is also very sick and disabled, also her adult son and grandson. The patient is worried about her husband. Denies that there has been any new stress or conflict at home.   PAST MEDICAL HISTORY: The patient is overweight. Has gastric reflux symptoms. History of dyslipidemia, history of gout, history of hypothyroidism, with a past history of recurrent episodes of extreme hypothyroidism, probably from medicine noncompliance.   SUBSTANCE ABUSE HISTORY: No substance abuse history.   CURRENT MEDICATIONS: Fluoxetine 40 mg a day, aspirin 81 mg a day, atorvastatin 10 mg per day, allopurinol 300 mg once a day, potassium chloride 20 mEq once a day, levothyroxine 200 mcg per day, losartan 25 mg a day, Ativan as needed, Colace 100 mg twice a day.   ALLERGIES: PENICILLIN.   REVIEW OF SYSTEMS: Complains of just feeling mostly tired and run down. Denies feeling sad, denies depression. Denies feeling a lot of negative thoughts about herself. Denies suicidal ideation. Denies any hallucinations.   MENTAL STATUS EXAMINATION: This is an elderly woman, awake in her hospital bed, cooperative with the interview. Made good eye contact. Psychomotor activity slow and somewhat sluggish. Speech slow, easy to understand. Affect blunted but not tearful. Mood stated as being tired. Thoughts are slow, not bizarre. No evidence of delusional thinking. Denied auditory or visual hallucinations. Denied suicidal or homicidal ideation. Showed reasonable insight and judgment. Normal intelligence. Alert and oriented x4.   LABORATORY RESULTS: By the report  of her primary care doctor, although the labs do not seem to have been in the chart here, she had some remarkably abnormal labs that were done today, including a white blood cell count of 22.3. Hemoglobin elevated at 16. Calcium elevated. Creatinine normal at 1.1. Chest x-ray: Some  interstitial markings. TSH not back yet.   ASSESSMENT: This is an 81 year old woman with a history of depression, getting maintenance ECT, brought into the hospital with nonspecific features, mostly of fatigue, chronic pain, just feeling out of sorts and down. Not necessarily full criteria for major depression, but could represent a worsening of her depressive and anxiety symptoms. Possibly may need ECT sooner than was otherwise scheduled. This evening, I do not have access to the ECT schedule. I am not sure when she was supposed to be coming back. Her last treatment was not too long ago, probably within the last month. The patient right now does not need any immediate change to her psychiatric treatment plan, but we need to follow it up in the hospital.   TREATMENT PLAN: No change to her Prozac. Await more lab studies and a diagnosis of any medical problems that may be troubling her. Have I informed ECT nurse of the patient's presence in the hospital. We would not be able to get her on the schedule for Monday but could potentially start ECT on Wednesday if needed. I will follow-up after the weekend. Supportive and educational therapy done with the patient, who agrees to the plan.   DIAGNOSIS, PRINCIPAL AND PRIMARY:  AXIS I: Major depression, severe, recurrent.   SECONDARY DIAGNOSES:  AXIS I: No further.   AXIS II: Deferred.   AXIS III: Multiple medical problems, acute and chronic.   AXIS IV: Severe stress from the burden of her illness and her husband's illness.   AXIS V: Functioning at time of admission 40.  TOTAL TIME SPENT ON CONSULTATION: 50 minutes.    ____________________________ Gonzella Lex, MD jtc:cg D: 01/31/2013 23:19:00 ET T: 01/31/2013 23:42:40 ET JOB#: [id number]  cc: Gonzella Lex, MD, <Dictator> Gonzella Lex MD ELECTRONICALLY SIGNED 02/03/2013 9:44

## 2014-07-17 NOTE — H&P (Signed)
PATIENT NAME:  Allison Gonzalez, GAFFIN MR#:  [id number] DATE OF BIRTH:  [birth year]  DATE OF ADMISSION:  01/31/2013  PRIMARY CARE PHYSICIAN: Adrian Prows, MD.  PRIMARY PSYCHIATRIST: Dr. Weber Cooks.   REASON FOR ADMISSION: Leukocytosis, altered mental status, falls with hip and back pain and pain control.   HISTORY OF PRESENT ILLNESS: This is a pleasant 81 year old female with multiple medical problems including severe chronic depression, hypertension, hypothyroidism, obesity, arthritis, who has been relatively well at home until the last few weeks. Her husband, who had been ill, was admitted to a skilled nursing facility, but just recently came home. Since he came home and has many needs, she has declined precipitously. She last had ECT with Dr. Weber Cooks October 6. Per her son, she had been doing relatively well at home, but has now had increasing depression, staring off into space and not participating, not acting herself and falling several times. She has had severe right hip and back pain since a fall 4 days ago where she tripped over a rug with her walker. She has had difficulty standing and ambulating. She has also had a cough and some labored breathing and reports shortness of breath for the last few days. She has had no fevers, chills, abdominal pain, nausea, vomiting, diarrhea, dysuria or hematuria. She reports taking her medications including her antidepressants and levothyroxine without issue.   PAST MEDICAL HISTORY: Severe chronic depression, hypertensive cardiovascular disease, hypothyroidism, hyperuricemia with gout, sleep apnea, obesity, degenerative arthritis and recurrent urinary tract infections.   SOCIAL HISTORY: The patient is married and lives with her husband, who has multiple medical problems. There is a lot of stress in their lives. She has 6 children. She does not smoke or drink. She uses a walker at home.   FAMILY HISTORY: Positive for mother dying of old age in her 63s. No other  significant family history.   ALLERGIES: No known drug allergies.   MEDICATIONS: Include aspirin 81 mg once a day, atorvastatin 10 mg once a day, fluoxetine 40 mg once a day, allopurinol 300 mg once a day, potassium chloride 20 mEq once a day,  24 hour 150 mg daily, levothyroxine 200 mcg daily, losartan 25 mg once a day, Ativan 0.5 every 8 hours as needed and Colace 100 mg twice a day.   REVIEW OF SYSTEMS: Very hard to obtain from the patient. Most of it is obtained from the son and as per history of present illness.   PHYSICAL EXAMINATION: VITAL SIGNS: Temperature 98, pulse 95, blood pressure 118/64 sat 98%, satting 98% on room air.  GENERAL: She is sitting in a wheelchair staring off. She is minimally interactive, but her eyes are awake. She does have some labored breathing. She appears chronically ill.  HEENT: Pupils equal, round and reactive to light and accommodation. Extraocular movements are intact. Sclerae are anicteric. Her oropharynx is clear, but her mucous membranes are dry. She has no oral lesions.  NECK: Supple. No anterior cervical, posterior cervical or supraclavicular lymphadenopathy.  HEART: Regular with a 2 out of 6 systolic murmur.  LUNGS: Poor air movement.  ABDOMEN: Soft, obese, nontender.  EXTREMITIES: She has 1+ bilateral lower extremity edema.  NEUROLOGIC: She is awake with her eyes open, but sitting in a wheelchair staring ahead. She can move all four extremities, but is otherwise relatively uncooperative with the exam. She has a very flat affect.  BACK: She has pain with movement of her right hip as well as pain to palpation over her right lateral  spine.   LABORATORY DATA: She has a white blood count elevated at 22.3, with 15.4 thousand neutrophils. Hemoglobin is elevated at 16, platelets are 316. Basic metabolic panel shows calcium elevated at 10.5, glucose 116, BUN is 17, creatinine 1.1, which is her baseline. BMP is pending. TSH is pending. Chest x-ray by my read  shows cardiomegaly with some increased interstitial markings. X-ray by my read of the right hip shows impressive narrowing and abnormality of the joint space, but no obvious fracture. L-spine shows moderate to severe arthritis, but again no obvious fracture. We will await official reads. Most recent outpatient stress test done in 2002 showed an ejection fraction of 67% and was negative for ischemia.   IMPRESSION: An 81 year old with multiple medical problems including severe depression, hypertensive cardiovascular disease and hypothyroidism, who is here with altered mental status, confusion, and staring off. She also seems severely depressed and previously had received ECT from Dr. Weber Cooks. She also has a right hip pain and back pain from a recent fall. She has labored breathing, although she is not hypoxic. Finally, she has a leukocytosis of 22,000 white cells.   PLAN:  1.  We will admit the patient for treatment and workup of above.  2.  Consult psychiatry for her altered mental status. She has seen Dr. Weber Cooks and will likely need to have ECT again. She may require transfer to the psych unit.  3.  Leukocytosis. Would check blood cultures and urinalysis. We will review her chest x-ray with radiology. We will hold on antibiotics at this point and recheck CBC in the morning.  4.  Hypothyroidism with TSH pending. We have to make sure she has been taking her medications and is not profoundly hypothyroid as she has been in the past when she has presented like this.  5.  Right hip and back pain after a fall. We will consult orthopedics for evaluation and recommendations.  6.  Will get physical therapy to see her as well. She may need placement. At the very least, she will need home health arranged.  7.  Deep vein thrombosis prophylaxis, we will place the patient on subcutaneous heparin.   TIME SPENT: This admission took 40 minutes.   ____________________________ Cheral Marker. Ola Spurr,  MD dpf:aw D: 01/31/2013 12:23:42 ET T: 01/31/2013 12:40:22 ET JOB#: [id number]  cc: Cheral Marker. Ola Spurr, MD, <Dictator> Aubryana Vittorio Ola Spurr MD ELECTRONICALLY SIGNED 02/05/2013 21:06

## 2014-07-17 NOTE — Consult Note (Signed)
PATIENT NAME:  Allison Gonzalez, Allison Gonzalez MR#:  [id number] DATE OF BIRTH:  05/11/1929  DATE OF CONSULTATION:  02/01/2013  REFERRING PHYSICIAN:   CONSULTING PHYSICIAN:  Timoteo Gaul, MD  REASON FOR CONSULTATION: Low back and lower extremity numbness.   HISTORY OF PRESENT ILLNESS: Ms. Guilfoil is an 81 year old female who was admitted by Dr. Ola Spurr for leukocytosis, altered mental status and fall with lower extremity and low back pain. I was consulted to see the patient for low back, hip pain and lower extremity numbness. The patient reports a fall approximately 4 days ago when she tripped over a rug with her walker. Since, she has difficulty standing and ambulating. The patient denied fevers, chills, abdominal pain, nausea, vomiting, diarrhea, dysuria or hematuria.   The patient today at the bedside states that she is having pain in the low back extending into the left hip and left lower extremity. She states that both of her lower extremities feel "asleep."   PAST MEDICAL HISTORY: Includes severe chronic depression, hypertension, cardiovascular disease, hypothyroidism, hyperuricemia with gout, sleep apnea, obesity, degenerative arthritis and recurrent UTIs.   SOCIAL HISTORY: The patient is married and lives with her husband. She currently has a lot of stress in her life. She has 6 children. She denies alcohol or tobacco use. She uses a walker at baseline.   ALLERGIES: No known drug allergies.   MEDICATIONS: Include aspirin 81 mg daily, atorvastatin 10 mg daily, fluoxetine 40 mg daily, allopurinol 300 mg daily, potassium chloride 20 mEq daily, levothyroxine 200 mcg daily, losartan 25 mg daily, Ativan 0.5 mg q.8 hours p.r.n. and Colace 100 mg b.i.d.   PHYSICAL EXAMINATION: The patient is sitting semi-upright in bed. She complains of pain in the low back extending into the left lateral flank and left hip, extending into the left lower extremity. She has intact sensation to light touch in both lower  extremities but states that both lower legs feel like they are asleep. She has intact motor function and can flex and extend all 10 toes. She can dorsiflex and plantar flex her ankle against resistance. The patient can actively flex and extend both knees and hips. She had no pain with internal or external rotation of the left hip and did not have significant tenderness over the left hip but did have tenderness in the low back between the levels L1 to approximately L3.   RADIOLOGY: X-ray films, including AP and lateral of the right hip and AP and lateral of the lumbar spine were reviewed today. X-ray of the right hip was performed based on the report I received during the consultation request. These films show no evidence of hip fracture. The lumbar spine, however, showed diffuse degenerative changes.   ASSESSMENT: Low back pain with lower extremity numbness.   PLAN: The patient seemed to have pain coming from the lower back radiating into the lower extremities, particularly on the left side. She also explains that she has numbness in the bilateral lower extremities. For this reason, the patient's symptoms seem more related to her low back. Given the degenerative changes seen on her x-ray, I am recommending the patient get an MRI of the lumbar spine to further examine the extent of her degenerative change in the lumbar spine as well as looking for herniated disk that may be causing radicular symptoms and numbness in her lower extremities. The patient may be evaluated by physical therapy as well. She may weight bear as tolerated as there is no evidence of fracture or  other trauma in her hips. She has mild degenerative changes in hip joint. I will follow up with the patient after the MRI has been completed and will follow her progress with physical therapy.   ____________________________ Timoteo Gaul, MD klk:gb D: 02/02/2013 23:54:51 ET T: 02/03/2013 00:16:22 ET JOB#: [id number]  cc: Timoteo Gaul, MD, <Dictator> Timoteo Gaul MD ELECTRONICALLY SIGNED 02/17/2013 19:23

## 2014-07-17 NOTE — Discharge Summary (Signed)
PATIENT NAME:  Allison Gonzalez, Allison Gonzalez MR#:  [id number] DATE OF BIRTH:  08-31-1929  DATE OF ADMISSION:  01/31/2013 DATE OF DISCHARGE:  02/04/2013  DISPOSITION: Behavioral health.  CONSULTATIONS: Consulting orthopedist, Dr. Mack Guise. Consulting psychiatrist, Dr. Weber Cooks.  PRIMARY CARE PHYSICIAN: Adrian Prows, MD  DISCHARGE DIAGNOSES: 1.  Severe depression.  2.  Urinary tract infection with leukocytosis.  3.  Coagulase-negative Staphylococcus bacteremia, likely contaminant. Repeat blood cultures negative.  4.  Fall with hip and back pain.  5.  Lumbar spine arthritis.   HISTORY OF PRESENT ILLNESS: Please see initial history and physical. Briefly, the patient was brought to the clinic on November 7th after having fallen and having back and hip pain. She was also having increased altered mental status. She had a marked leukocytosis with 20,000 white cells. She was admitted for further evaluation.   HOSPITAL COURSE BY ISSUE:  1.  Urinary tract infection. This was likely cause of her leukocytosis. Cultures were mixed. She was treated with IV levofloxacin and then transitioned to oral and her clinical symptoms improved.  2.  Coag-negative staph bacteremia. This is likely contaminant. It was in 1 to 2 bottles. Follow-up blood cultures were negative.  3.  Altered mental status. This was likely a combination of urinary tract infection as well as severe depression. She clinically improved and was seen by psychiatry. They recommend behavioral health and possibly ECT. She will be discharged to behavioral health.  4.  Hypothyroidism. Her outpatient TSH was normal on the day of admission. We continued her on her current dose.  5.  Fall and right hip and back pain. She was seen by Dr. Mack Guise who recommended conservative therapy. She did have a MRI of his spine, which showed no acute abnormalities, but did show chronic disk disease. It was considered she could have an outpatient lumbar spinal injection if  needed.   DISCHARGE MEDICATIONS: 1.  Allopurinol 300 mg once a day.  2.  Aspirin 81 mg once a day.  3.  Losartan 25 mg once a day.  4.  Buspirone 150 mg XL once a day.  5.  Fluoxetine 40 mg once a day.  6.  Atorvastatin 10 mg once a day.  7.  Levothyroxine 200 once a day.  8.  Ativan 0.5 mg 3 times a day.  9.  Colace 100 mg twice a day.  10.  Klor-Con 20 mEq once a day.  11.  Tylenol 325 mg 2 capsules every 4 hours as needed for pain.  12.  Pantoprazole 40 mg once a day.   DISCHARGE DIET: Low sodium, regular consistency.   ACTIVITY AT DISCHARGE: As tolerated.   DISCHARGE FOLLOWUP:  The patient will follow up with Dr. Ola Spurr in 1 to 2 weeks.   CODE STATUS: FULL CODE.  Please note, the patient will not need any further antibiotics as she finished a course for her urinary tract infection.   TIME SPENT ON DISCHARGE: 40 minutes.  ____________________________ Cheral Marker. Ola Spurr, MD dpf:sb D: 02/04/2013 08:32:00 ET T: 02/04/2013 08:46:18 ET JOB#: [id number]  cc: Cheral Marker. Ola Spurr, MD, <Dictator> Ausencio Vaden Ola Spurr MD ELECTRONICALLY SIGNED 02/05/2013 21:06

## 2014-07-17 NOTE — Consult Note (Signed)
PATIENT NAME:  Allison Gonzalez, Allison Gonzalez MR#:  [id number] DATE OF BIRTH:  10-01-1929  DATE OF CONSULTATION:  01/31/2013  CONSULTING PHYSICIAN:  Timoteo Gaul, MD  REASON FOR CONSULTATION: Low back and hip pain.   HISTORY OF PRESENT ILLNESS: Allison Gonzalez an 81 year old female with complaints of low back pain after a fall at home. She reportedly fell after she tripped over a rug at home with her walker. She is now had back pain for approximately 4 days since the fall and has had difficulty standing and ambulating. She complains of pain at the bedside today. She complains of pain, particularly over the left flank and low back. She states that her lower extremities "feel asleep." The patient denies any significant groin pain. She has not noted any loss of motion and or significant weakness of the lower extremities.   She has been admitted by Dr. Adrian Prows, her primary care physician. I am consulted for by Dr. Ola Spurr for the back and lower extremity symptoms.   PAST MEDICAL HISTORY: Includes severe chronic depression, hypertension, cardiovascular disease, hypothyroidism, hyperuricemia with gout, sleep apnea, obesity, general degenerative arthritis, and recurrent UTIs.   SOCIAL HISTORY: The patient is married and lives with her husband who also has multiple medical problems. She has 6 children. The patient is reported not to smoke or drink. She uses a walker at home for assistance with ambulation.   ALLERGIES: No known drug allergies.   MEDICATIONS: Include aspirin 81 mg daily, atorvastatin 10 mg daily, fluoxetine 40 mg daily, allopurinol 300 mg daily, potassium chloride 20 mEq daily, levothyroxine 200 mcg daily, losartan 25 mg daily, Ativan 0.5 mg every 8 hours p.r.n., and Colace 100 mg b.i.d.   PHYSICAL EXAMINATION:  GENERAL: The patient is sitting upright in her hospital bed. She states that she is having low back pain. She is tender between the levels of L3 and L5. The pain includes the left  paraspinal area as well. She had minimal pain with logrolling of the left and right hips. She had intact motor function throughout the bilateral lower extremities. She can extend and flex her knees actively as well as dorsiflex and plantarflex her ankles and flex and extend all 5 toes. She had intact sensation to light touch throughout the lower extremities, but in the lower legs and feet, she states that her feet " feel asleep," even though she can feel me touching them. She has palpable dorsalis pedis pulse.   RADIOLOGY: The consult initially across as low back and right hip pain. I had ordered right hip films and low back films as well. These were performed this afternoon. Today, however, at the bedside, her pain is more left-sided. An AP of the pelvis demonstrates no evidence of fracture. She had mild degenerative changes in both hip joints. There is no evidence of pelvis or sacral fractures. The lumbar spine films which included AP and lateral, show severe multilevel degenerative disk disease. There is calcification of her aorta.   ASSESSMENT: Lumbar back pain with paresthesias in the lower extremity.   PLAN: I explained to Allison Gonzalez that despite the tenderness in her low back, there was no evidence of fracture on her x-rays, but she had severe degenerative changes. She may have a concomitant herniated disk after her fall.   I recommend that again an MRI of the lumbar spine to evaluate for herniated disk. She may be a candidate for cortisone injection.   I also would recommend that we try to find a  lumbosacral corset to fit her which may provide some additional support to her low back.   I also recommended physical therapy evaluation. The patient may weight-bear as tolerated. The medical service will treat her pain symptomatically.   I will follow up with the MRI once this has been completed.   ____________________________ Timoteo Gaul, MD klk:np D: 01/31/2013 17:52:18 ET T: 01/31/2013  18:13:27 ET JOB#: [id number]  cc: Timoteo Gaul, MD, <Dictator> Timoteo Gaul MD ELECTRONICALLY SIGNED 02/17/2013 19:22

## 2014-07-17 NOTE — Consult Note (Signed)
Psychiatry: Patient "not feeling right". Affect dysphoric. Mood down. Energy low, denies any suicidal intent but has had some passive SI. Flat interactions. Medically seems to be better and more stable. On oral antibiotics. Denies acute pain. MDE recurrent. Plan is to admit to [facility] with plan for ECT Wednesday and then reevaluate for possible discharge after that. Patient counceled and agrees to the plan.  Electronic Signatures: Cailee Blanke, Madie Reno (MD)  (Signed on 10-Nov-14 12:49)  Authored  Last Updated: 10-Nov-14 12:49 by Gonzella Lex (MD)

## 2014-07-17 NOTE — Consult Note (Signed)
Brief Consult Note: Diagnosis: major depression recurrent.   Patient was seen by consultant.   Consult note dictated.   Comments: Psychiatry: Patient seen. 81 year old woman known to me from ECT. Reviewed admission note. PAtient tells me she has not been feeling herself and has been more run down though she does not feel depressed. Sleeps well. Denies any SI and denies having a lot of negative thoughts. She has been hurting more lately since having a fall. Still unclear why WBC so high and still pending TSH. I suggest no change to psych meds. We could get her on for ECT on Wednesday at the earliest if needed. Will notify ECT nurse and will follow on Monday.  Electronic Signatures: Gonzella Lex (MD)  (Signed [id number] 19:41)  Authored: Brief Consult Note   Last Updated: 07-Nov-14 19:41 by Gonzella Lex (MD)

## 2014-07-17 NOTE — Discharge Summary (Signed)
PATIENT NAME:  Allison Gonzalez, Allison Gonzalez MR#:  [id number] DATE OF BIRTH:  September 11, 1929  DATE OF ADMISSION:  02/04/2013 DATE OF DISCHARGE:  02/18/2013  HOSPITAL COURSE: See dictated history and physical for details of admission. This 81 year old woman was admitted to the hospital, transferred from the medical service. She had presented there with decreased mentation, decreased activity, decreased appetite, appeared to be having depression. No clear medical cause for it. She was transferred to our service with the intention of beginning ECT. She was managed with medications and had some adjustments, including increasing her with Wellbutrin and also had a regular index course of ECT. A total of six, right unilateral ECT treatments were done during her hospitalization. The patient showed gradual improvement in her appetite, and began eating spontaneously. Her mood was stated as feeling better. She appeared to be smiling more. We continued to have trouble with getting her motivated to get out of bed. She continued to tend to use the bathroom in her diaper, which led to increased skin breakdown. She was counseled about the medical necessity of being more active in getting up and regaining her strength, and the life and death nature of it. She did cooperate with physical therapy to some extent. By the time of discharge, she appeared to have plateaued as far as acute benefit from ECT. Family visited, and felt it was best to have her come home. We had suggested to the patient that she go to rehab, but she declined this. At this point, the plan as far as outpatient psychiatric treatment is for her to follow up with another ECT treatment next Monday, which she agrees with this. We will re-evaluate it at that point and see if we can stretch it back out again to more infrequent treatments.   DISCHARGE MEDICATIONS: Fentanyl patch 50 mcg once every three days, oxycodone 5 mg every six hours as needed for pain, losartan 25 mg once a day,  Wellbutrin XL 300 mg once a day, Prozac 40 mg once a day, atorvastatin 10 mg once a day, allopurinol 300 mg once a day, aspirin 81 mg once a day, lorazepam 3 times a day as needed for anxiety 0.5 mg, docusate 100 mg 2 capsules twice a day, potassium chloride 20 mEq once a day, pantoprazole 40 mg once a day, levothyroxine 200 mcg once a day.   LABORATORY RESULTS: Most recent urinalysis looks clean, not infected. Chemistry panel done on the 12th  showed elevated alkaline phosphatase 431, elevated AST 61, albumin low at 3. TSH low at 0.373. CBC was normal. EKG: Sinus rhythm with premature ventricular complexes.   CONSULTS: Orthopedic consultant saw the patient noted severe degenerative disk disease. Thus I began chronic pain management. They mainly recommended physical therapy.   MENTAL STATUS EXAMINATION: A somewhat disheveled elderly woman, passively cooperative. Eye contact good. Psychomotor activity still very passive and slow. Affect smiling more. Mood stated as okay. Thoughts slow but lucid. No obvious loosening of associations or delusions. Denies auditory or visual hallucinations. Denies suicidal or homicidal ideation. Cognitive function at this point slowed down. Alert and oriented x4. Judgment and insight improved.   DIAGNOSIS, PRINCIPAL AND PRIMARY:  AXIS I: Major depression, severe, recurrent.   SECONDARY DIAGNOSES: AXIS I: No further.   AXIS II: Deferred.   AXIS III: Obesity, pain from degenerative disk disease, skin breakdown, hypertension, dyslipidemia, gout, chronic constipation, hypokalemia, hypothyroidism, gastric reflux symptoms.   AXIS IV: Severe from her illness and her pain.   AXIS V: Functioning at  time of discharge 50.     ____________________________ Gonzella Lex, MD jtc:cg D: 02/18/2013 23:51:21 ET T: 02/19/2013 00:06:20 ET JOB#: [id number]  cc: Gonzella Lex, MD, <Dictator> Gonzella Lex MD ELECTRONICALLY SIGNED 02/19/2013 8:14

## 2014-07-17 NOTE — H&P (Signed)
PATIENT NAME:  Allison Gonzalez, Allison Gonzalez MR#:  [id number] DATE OF BIRTH:  December 12, 1929  DATE OF ADMISSION:  02/04/2013  IDENTIFYING INFORMATION AND CHIEF COMPLAINT:  An 81 year old woman referred for transferred to the psychiatry service from the internal medicine service.   CHIEF COMPLAINT:  "I don't know what you want me to do."   HISTORY OF PRESENT ILLNESS:  Information obtained from the patient and the chart. She was admitted to medicine and the orthopedic service on 11/07. Her primary care doctor admitted her because the patient's son had reported that she had been going downhill over the last couple of weeks. By that, it was indicated that she had mentally been less alert, less interactive, speaking less, just sitting and staring at the wall not moving around. Affect has appeared more depressed and at times more confused. There was no clear reason for this. Son felt this was typical of her previous depressions and the primary care doctor agreed. The patient was admitted to the hospital and work-up did not display any specific new medical problems. She is complaining of chronic pain, which has gotten quite a bit worse in her back. Orthopedics saw her and an MRI was obtained, which showed pretty extensive disk disease. Nevertheless, the patient admits to me that she is feeling more depressed. Her energy has been low. Her interest in normal activities has been low. Her energy level is poor. Her sleep is poor and her appetite is worse. She has probably lost a little bit of weight. She says that occasionally she has some hopeless thoughts or wishing that she was dead, but has not had any specific intention of harming herself. Denies any hallucinations. She says she has been taking her medicines regularly. The patient was last seen for ECT in October.   PAST PSYCHIATRIC HISTORY:  Long history of depression going back about 10 years at least. Has had several hospitalizations back than with Dr. Thurmond Butts and received ECT on  several occasions with good ultimate results. She has been maintained on ECT maintenance treatment for years and has tolerated it well. The patient does not have a history of suicide attempts but she does have a history of confusion, delirium and possible psychosis with her depression.   SOCIAL HISTORY: The patient lives with her husband. Both of them are in declining health. There is an adult son who evidently lives at home as well. The patient indicates that there has not been any new stress or tension at home.   PAST MEDICAL HISTORY: Chronic obesity. Dyslipidemia. Gout. Hypothyroidism, hypertension, does not have diabetes.  MEDICATIONS:  When she came into the hospital on the 7th, she was taking fluoxetine 40 mg a day, allopurinol 300 mg a day, potassium chloride 20 mEq per day, Wellbutrin 150 mg per day, levothyroxine 200 mcg per day, losartan 25 mg per day, Ativan p.r.n., Colace 100 mg twice a day.   ALLERGIES:  PENICILLIN.   FAMILY HISTORY:  Positive for some depression.   SUBSTANCE ABUSE HISTORY: Does not drink, does not abuse drugs. No history of substance abuse problems.   REVIEW OF SYSTEMS:  Feels tired, confused, run down. Lack of interest and motivation. Feeling depressed. Denies hallucinations. Denies any suicidal intent or plan. Has pain that she mainly localizes to her left lower back. She has been walking less than usual lately.   MENTAL STATUS EXAMINATION:  Reasonably groomed 81 year old woman interviewed in her hospital bed. She was alert and awake and engaged in the interview. Made pretty good  eye contact. Psychomotor activity was more run down and sluggish than I usually see in her. Speech was slower and decreased in amount. Affect was flat and looked anxious. Mood was stated as being bad. Thoughts seemed even more slow than normal. No evidence of delusions, although she tends to repeat herself now saying, she does not know what to do, does not know how we want her to behave and  these are not typical things for her. She denies acute suicidal ideation, although admits to some hopelessness. Denies hallucinations. Alert and oriented to situation. At first she thought she was in a convalescent home, but she knew who I was and knew the date. When I told her that she was still in the hospital, she could remember that adequately afterwards.   PHYSICAL EXAMINATION: GENERAL: Overweight woman, looks her stated age.  SKIN: No acute skin lesions identified.  HEENT: Pupils equal and reactive. Face symmetric. Oral mucosa normal. Pain has been localized to the left lower back. She is stiff all over in her back, has trouble moving around, trouble sitting up.  EXTREMITIES:  Normal has strength in her extremities, but I did not get her to try to stand up and ambulate. Reflexes normal.  LUNGS: Clear. No wheezes.  HEART: Regular rate and rhythm.  ABDOMEN: Nontender, normal bowel sounds.  VITAL SIGNS: Currently show a temperature of 97.2, pulse 64, respirations 18, blood pressure 139/81.   LABORATORY RESULTS: Chest x-ray was done this evening and showed no active disease. EKG pending. CBC all normal. Chemistry panel unremarkable. Sodium a little bit low. Blood cultures when she was in the hospital from just a couple days ago are negative. Urinalysis was unremarkable at that time. MRI of the lumbar spine shows very extensive disk disease throughout the whole lumbar area.   ASSESSMENT:  An 81 year old woman with chronic severe depression, seems to be having a relapse. Getting worse at home, not functioning well, not eating well, more run down. Probably not attributable to her medical problems, although they are certainly not helping. I have recommended transfer to psychiatry so that we can begin a course of ECT. The patient is agreeable to the treatment plan. Orders will be, ECT will begin tomorrow right unilateral, standard medications. No change to psychiatric treatment otherwise. The patient is  agreeable to the plan.   DIAGNOSIS, PRINCIPAL AND PRIMARY:  AXIS I:  Major depression, severe, recurrent.   SECONDARY DIAGNOSES: AXIS I:  No further.  AXIS II:  No diagnosis.  AXIS III:  Obesity, chronic pain, hypothyroidism, hypertension, worsening disk disease in her back contributing to difficulty walking, as well as pain.  AXIS IV:  Severe from advancing age and accompanying illnesses.  AXIS V:  Functioning at time of evaluation 30.   ____________________________ Gonzella Lex, MD jtc:ce D: 02/04/2013 22:37:36 ET T: 02/04/2013 23:05:17 ET JOB#: [id number]  cc: Gonzella Lex, MD, <Dictator> Gonzella Lex MD ELECTRONICALLY SIGNED 02/05/2013 17:47

## 2014-07-17 NOTE — H&P (Signed)
PATIENT NAME:  Allison Gonzalez, Allison Gonzalez MR#:  [id number] DATE OF BIRTH:  [birth year]  DATE OF ADMISSION:  03/16/2012  PRIMARY CARE PHYSICIAN: Andrey Farmer, MD.  ER PHYSICIAN:  Francene Castle, MD  CHIEF COMPLAINT:  Confusion.   HISTORY OF PRESENT ILLNESS: The patient is an 81 year old female patient with multiple medical problems of hypertension, depression, hypothyroidism, hyperlipidemia and history of gout.  The patient follows up with Dr. Thurmond Butts for bipolar disorder and depression, brought in by family because of confusion.  The patient was evaluated in the Emergency Room and was found to have a UTI, agitation and confusion and medicine was contacted for her confusion and agitation. According to the ER charts, the patient has been having 1 good day and 1 bad day. The patient sometimes responds inappropriately to the questions, sometimes she does not. The patient was seen in the Emergency Room and when I entered the room, she was very quiet, but when I started to examine, she turned and said she is feeling like her stomach is hurting and she does not know why she is here.  I explained to her she has urine infection and she is going to be getting IV antibiotics and urine cultures and going to be admitted to medical service and psychiatry service consult has been placed in the Emergency Room because of her episodes of confusion.  I tried to call the patient's home number at [phone]; there is no answer. I will call then again and find out what is going on with the patient  PAST MEDICAL HISTORY: Significant for history of depression, hyperlipidemia, gout, hypertension and  hypothyroidism.  The history also includes depression.  She sees Dr. Thurmond Butts for that.  She has received ECT treatment for postpartum depression.  ALLERGIES: SHE IS ALLERGIC TO PENICILLIN.  SOCIAL HISTORY: No smoking. No drinking.   FAMILY HISTORY: No family history of hypertension or diabetes.   PAST SURGICAL HISTORY: Significant for  cholecystectomy,  D and C's and breast biopsies.  REVIEW OF SYSTEMS: The patient unable to give me any review of systems because  she is confused.  MEDICATIONS: Allopurinol 300 mg p.o. daily, aspirin 81 mg daily, atorvastatin 10 mg daily bupropion 150 mg p.o. daily, Colace 100 mg daily, fluoxetine 40 mg p.o. daily, hydrochlorothiazide with triamterene 25/37.5 every other day, levothyroxine 175 mcg p.o. daily, Norco 5/320, 1 tablet p.o. daily, losartan 20 mg p.o. daily, progesterone 200 mg p.o. daily at bedtime and vitamin D 400 International Units 2 tablets daily in the morning.   PHYSICAL EXAMINATION:  VITAL SIGNS: Temperature 97.9, heart rate 89, respirations 28, O2 sats 98% on room air, blood pressure 147/89. GENERAL:  She is an elderly female looking stated for her age.  Overall, the patient has psychomotor slowing and answering very slowly. She is oriented to  time, place, person.  HEENT: Head atraumatic, normocephalic. Pupils are equal and reacting to light. Extraocular movements intact.  NECK: Supple. No JVD.  CARDIOVASCULAR: S1 and S2 regular. No murmurs. Carotid pulses present.  LUNGS: Clear to auscultation.  ABDOMEN: Soft, nontender, nondistended. Bowel sounds present. No CVA tenderness. No hernias.  EXTREMITIES: There is 1+ edema.  NEUROLOGIC: Sensations are intact. DTRs 2+ bilaterally.  LABORATORY AND DIAGNOSTIC DATA:  Sodium 137, potassium 4, chloride 106, bicarbonate 20, BUN 18, creatinine 0.93, glucose 85. LFTs within normal limits. WBC 12.6, hemoglobin 15.3, hematocrit 46.3, platelets 252.  Salicylate less than 1.7. Urine toxicology positive for MDMA.  TSH 2.09. Urine is leukocyte esterase 1+, nitrites negative,  WBC 511 and bacteria 3+.   ASSESSMENT AND PLAN:   1.  An 81 year old female with altered mental status likely due to a combination of her underlying psychiatric disorder and also urinary tract infection. The patient is going to be admitted for overnight observation for  her urinary tract infection. Follow the urine cultures. Continue Levaquin..  SHE IS ALLERGIC TO PENICILLIN.  She is going to have sputum some intravenous fluids, normal saline up to 1 liter at 50 mL/h.  2.  Bipolar disorder. Recent change in mental status with on and off good days and bad days. The patient sees Dr. and had electroconvulsive shock treatments before for her bipolar depression. The patient right now is on fluoxetine 40 mg daily that will be continued and psychiatric consultation to follow-up on her mental status.  3.  Hypertension. Continue to monitor. 4.  Gout.  Continue allopurinol. 5.  Hypothyroidism. Continue Levothyroxine.  TSH is normal. The patient has major depression and history of electroconvulsive shock treatments before. Psychiatric consult pending.  6.  Gastrointestinal and deep vein thrombosis prophylaxis.  Time Spent on history and physical: About 55 minutes.   ____________________________ Epifanio Lesches, MD sk:ct D: 03/16/2012 13:20:21 ET T: 03/16/2012 14:05:07 ET JOB#: [id number]  cc: Epifanio Lesches, MD, <Dictator> Epifanio Lesches MD ELECTRONICALLY SIGNED 04/21/2012 22:45

## 2014-07-17 NOTE — Consult Note (Signed)
Brief Consult Note: Diagnosis: Low back pain with numbess in bilateral lower extremities.   Patient was seen by consultant.   Consult note dictated.   Recommend further assessment or treatment.   Comments: MRI of lumbar spine ordered.  Patient with point tenderness over lower lumbar spine levels.  Patient states she is having low back pain and her legs "feel asleep".  Had recent fall.  Lumbar spine radiographs show advanced degenerative disc disease.  Patient may have concomitant disc herniation.  Lumbosacral corset ordered.  PT to evaluate tomorrow.  Electronic Signatures: Thornton Park (MD)  (Signed [id number] 17:31)  Authored: Brief Consult Note   Last Updated: 07-Nov-14 17:31 by Thornton Park (MD)

## 2014-07-18 NOTE — Op Note (Signed)
PATIENT NAME:  Allison Gonzalez, Allison Gonzalez MR#:  [id number] DATE OF BIRTH:  1929/12/20  DATE OF PROCEDURE:  05/16/2013  PREOPERATIVE DIAGNOSIS: Carcinoma of the right breast.   POSTOPERATIVE DIAGNOSIS: Carcinoma of the right breast.   PROCEDURE: Right partial mastectomy with axillary sentinel lymph node biopsy.   SURGEON: Rochel Brome, M.D.   ANESTHESIA: General.   INDICATIONS: This 81 year old female recently had a mammogram that demonstrated a density in the outer aspect of the right breast. Ultrasound demonstrated a hypoechoic 6 mm nodule. Core needle biopsy demonstrated infiltrating mammary carcinoma and surgery was recommended for definitive treatment. She did have preoperative injection of radioactive technetium sulfur colloid. She had insertion of a Kopans wire which entered the lateral aspect of the right breast. Mammogram images were reviewed prior to surgery seeing the association of the biopsy marker with the thick portion of the wire.   The patient was placed on the operating table in the supine position. Under general anesthesia, the dressing was removed from the lateral aspect of the right breast. The Kopans wire was cut 2 cm from the skin. It is noted that according to the radiologist and the x-ray, the tip of the marker was 6 cm from the skin. It appeared to me that the biopsy marker was approximately 4 cm from the skin. The Kopans wire was cut 2 cm away from the skin and the breast was prepared with ChloraPrep and draped in a sterile manner.   A curvilinear incision was made from approximately the 8 o'clock position to the 10 o'clock position of the peripheral aspect of the right breast and dissected down to encounter the Kopans wire. Next, a portion of tissue surrounding the thick portion of the wire was dissected and also dissected down as far as the tip of the wire which is adjacent to the chest wall. The specimen was labeled so that a stitch was placed at the 10 o'clock position of the skin  ellipse, and this was recorded for the pathologist's information.   I also applied margin map markers to the specimen to mark the medial, lateral, cranial, caudal, and deep margins. This was submitted for specimen mammogram.   The wound was inspected. It is noted that 1 clamped bleeding point was suture ligated with 4-0 chromic. Several small bleeding points were cauterized. Hemostasis was subsequently intact.   Next, attention was turned to the axilla where the axilla was probed with a gamma counter and demonstrated a small amount of radioactivity in the axilla. An oblique 5 cm incision was made  in the inferior aspect of the axilla and carried down through subcutaneous tissues through superficial fascia and dissected down deeply within the axilla and did encounter some radioactivity and dissected out a portion of fatty tissue with radioactivity. I did not feel any hard mass. Dissected out a portion of fatty tissue which was approximately 3 x 3 x 3 cm. Ex vivo maximal count was 30 counts per second. Background count was minimal. There was no remaining palpable mass within the axilla and the fatty tissue was submitted for pathology for a sentinel lymph node. The wound was inspected. Two clamped bleeding points were suture-ligated with 4-0 chromic. Several small bleeding points were cauterized. Hemostasis was subsequently intact.   Next, the radiologist called and reported that the biopsy marker was in the specimen, and it appeared that it may be close to the medial margin. This was sent on to the pathologist, who called and reported the finding of the biopsy  cavity and it appeared that the medial margin was approximately 5 mm and appeared adequate. The wounds were inspected prior to closure. Hemostasis appeared to be intact. Each wound was closed with interrupted 4-0 chromic subcutaneous sutures and each wound was closed with running 4-0 Monocryl subcuticular suture and Dermabond.   The patient tolerated  surgery satisfactorily and was then prepared for transfer to the recovery room.   ____________________________ Lenna Sciara. Rochel Brome, MD jws:np D: 05/16/2013 14:01:17 ET T: 05/16/2013 15:50:51 ET JOB#: [id number]  cc: Loreli Dollar, MD, <Dictator> Loreli Dollar MD ELECTRONICALLY SIGNED 05/21/2013 16:15

## 2014-07-18 NOTE — Consult Note (Signed)
Reason for Visit: This 81 year old Female patient presents to the clinic for initial evaluation of  breast cancer .   Referred by Dr. Oliva Bustard.  Diagnosis:  Chief Complaint/Diagnosis   81 year old female with stage IB (T1 B. N0 M0) ER/PR positive HER-2/neu negative invasive mammary carcinoma of the right breast status post wide local excision and sentinel node biopsy. The patient for hypofractionated right breast radiation  Pathology Report pathology report reviewed   Imaging Report mammograms reviewed   Referral Report clinical notes reviewed   Planned Treatment Regimen adjuvant whole breast radiation plus aromatase inhibitor   HPI   patient is an 81 year old female who presented with an abnormal mammogram of her right breast showing a 7 mm nodule with obscure margins confirmed on ultrasound and the indistinct borders in the 9:00 position suspicious for malignancy.she underwent stereotactic biopsy was which was positive for invasive mammary carcinoma. Did not have a wide local excision for a 0.7 cm grade 1 invasive mammary carcinoma. 4 sentinel lymph nodes were negative. Tumor was strongly ER/PR positive HER-2/neu not over expressed. Margin was clear a 1.2 cm. She's done well postoperatively. She has problems with transportation as well as ambulating. Recommendations were made for whole breast radiation she is seen today for opinion. She is doing fairly well. She continues to heal with only minor tenderness in lumpectomy site.  Past Hx:    Bipolar DO/depressed:    Renal insuffiency:    gout:    anxiety:    sleep apnea:    obesity:    ECT:    sleep apnea:    post menopausal bleeding:    hyperuricemia with gout:    hypothyroidism:    Other, see comments: PT STATES AT AGE 25 HAD HEART ATTACH AND ARRESTED - +DEFIB.  #2 MI AT AGE 65 WITH STENT PLACEMENT X 1   Hypercholesterolemia:    HTN:    Cardiac Disease:    breast biopsy:    hernia repair:    Cholecystectomy:     Cataract Extraction:   Past, Family and Social History:  Past Medical History positive   Cardiovascular coronary artery disease; hyperlipidemia; hypertension   Respiratory sleep apnea   Genitourinary renal insufficiency   Endocrine hypothyroidism   Neurological/Psychiatric anxiety; bipolar disorder   Past Surgical History cholecystectomy; hysterectomy, cataract extraction, herniorrhaphy repair   Past Medical History Comments gout, morbid obesity, hyperuricemia, postmenopausal bleeding   Family History noncontributory   Social History noncontributory   Additional Past Medical and Surgical History a company by her husband and son today. Husband was a former prostate cancer patient of mine   Allergies:   Penicillin: Unknown  Home Meds:  Home Medications: Medication Instructions Status  letrozole 2.5 mg oral tablet 1 tab(s) orally once a day Active  buPROPion 300 mg/24 hours (XL) oral tablet, extended release 0.5 tab(s) orally once a day Active  losartan 25 mg oral tablet 1 tab(s) orally once a day (in the morning) blood pressure Active  FLUoxetine 40 mg oral capsule 1 cap(s) orally once a day depression Active  atorvastatin 10 mg oral tablet 1 tab(s) orally once a day cholesterol Active  allopurinol 300 mg oral tablet 1 tab(s) orally once a day gout Active  aspirin 81 mg oral tablet, chewable 1 tab(s) orally once a day stroke prevention Active  LORazepam 0.5 mg oral tablet 1 tab(s) orally 3 times a day, As Needed, anxiety , As needed, anxiety Active  docusate sodium 100 mg oral capsule 2 cap(s) orally  2 times a day constipation Active  potassium chloride 20 mEq oral tablet, extended release 1 tab(s) orally once a day potassium supplement Active  levothyroxine 200 mcg (0.2 mg) oral tablet 1 tab(s) orally once a day thyroid Active  Norco 325 mg-5 mg oral tablet 1 tab(s) orally every 6 hours, As Needed - for Pain Active   Review of Systems:  General negative    Performance Status (ECOG) 0   Skin negative   Breast see HPI   Ophthalmologic negative   ENMT negative   Respiratory and Thorax negative   Cardiovascular negative   Gastrointestinal negative   Genitourinary negative   Musculoskeletal negative   Neurological negative   Psychiatric negative   Hematology/Lymphatics negative   Endocrine negative   Allergic/Immunologic negative   Review of Systems   review of systems obtained from nurses notes  Nursing Notes:  Nursing Vital Signs and Chemo Nursing Nursing Notes: *CC Vital Signs Flowsheet:   16-Mar-15 10:49  Temp Temperature 98.1  Pulse Pulse 87  Respirations Respirations 20  SBP SBP 157  DBP DBP 85  Pain Scale (0-10)  0  Height (cm) centimeters 167.5   Physical Exam:  General/Skin/HEENT:  Skin normal   Eyes normal   ENMT normal   Head and Neck normal   Additional PE well-developed obese female wheelchair bound in NAD. Lungs are clear to A&P chronic examination shows regular rate and rhythm. Right eye shows a wide local excision which is healing well. No dominant mass or nodularity is noted in either breast in 2 positions examined. Her breasts are are of a small to medium size. Abdomen is obese and benign.   Breasts/Resp/CV/GI/GU:  Respiratory and Thorax normal   Cardiovascular normal   Gastrointestinal normal   Genitourinary normal   MS/Neuro/Psych/Lymph:  Musculoskeletal normal   Neurological normal   Lymphatics normal   Other Results:  Radiology Results: LabUnknown:    19-Jan-15 15:19, Digital Additional Views Rt Breast (SCR)  PACS Image   [facility]:  Digital Additional Views Rt Breast (SCR)   REASON FOR EXAM:    av rt asymmetry  COMMENTS:       PROCEDURE: MAM - MAM DIG ADDVIEWS RT SCR  - Apr 14 2013  3:19PM     CLINICAL DATA:  Abnormal right screening mammogram.    EXAM:  DIGITAL DIAGNOSTIC  RIGHT MAMMOGRAM WITH CAD    ULTRASOUND RIGHT BREAST    COMPARISON:  With  [facility].    ACR Breast Density Category b: There are scattered areas of  fibroglandular density.    FINDINGS:  Additional imaging of the right breast was obtained. In the lateral  aspect of the breast there is persistence of a7 mm nodule with  obscured margins. There are no associated malignant type  microcalcifications.    Mammographic images were processed with CAD.    On physical exam, I do not palpate a mass in the right breast..    Ultrasound is performed, showing there is a 6 x 6 x 6 mm hypoechoic  mass with indistinct borders in the right breast at 9 o'clock 7 cm  from the nipple. Sonographic evaluation of the right axilla does not  show any enlarged adenopathy.     IMPRESSION:  Suspicious right breast mass.  Tissue sampling is recommended.    RECOMMENDATION:  Ultrasound-guided core biopsy of the right breast is recommended.  This will be scheduled at the patient's convenience.    I have discussed the findings and recommendations with  the patient.  Results were also provided in writing at the conclusion of the  visit.    BI-RADS CATEGORY  4: Suspicious abnormality - biopsy should be  considered.  Electronically Signed    By: Lillia Mountain M.D.    On: 04/14/2013 15:48         Verified By: Mordecai Rasmussen L. ARCEO, M.D.,   Assessment and Plan: Impression:   stage I invasive mammary carcinoma in 81 year old female status post wide local excision sentinel node biopsy ER/PR positive. Plan:   at this time I have recommended whole breast radiation. Based on her stress size we can go ahead and treat with hypofractionated radiation therapy 16 fractions. Also we'll not do a scar boost based on the widely clear margins. This is acceptable to the patient and her family and will be able to provide for daily transportation. Risks and benefits of treatment including skin reaction, fatigue, inclusion of some superficial lung, possible alteration of blood counts, all are explained in detail to  the patient. Both her and her family seem to comprehend my treatment plan well. I have set her up for CT simulation later this week.  I would like to take this opportunity for allowing me to participate in the care of your patient..  CC Referral:  cc: Dr. Tamala Julian, Dr. Ola Spurr   Electronic Signatures: Baruch Gouty, Roda Shutters (MD)  (Signed 16-Mar-15 12:33)  Authored: HPI, Diagnosis, Past Hx, PFSH, Allergies, Home Meds, ROS, Nursing Notes, Physical Exam, Other Results, Encounter Assessment and Plan, CC Referring Physician   Last Updated: 16-Mar-15 12:33 by Armstead Peaks (MD)

## 2014-07-29 ENCOUNTER — Other Ambulatory Visit: Payer: Self-pay | Admitting: *Deleted

## 2014-07-29 DIAGNOSIS — C50919 Malignant neoplasm of unspecified site of unspecified female breast: Secondary | ICD-10-CM

## 2014-08-11 ENCOUNTER — Ambulatory Visit
Admission: RE | Admit: 2014-08-11 | Discharge: 2014-08-11 | Disposition: A | Discharge status: Home / Self Care | Payer: Medicare Other | Source: Ambulatory Visit | Attending: Oncology | Admitting: Oncology

## 2014-08-11 ENCOUNTER — Ambulatory Visit: Payer: Self-pay

## 2014-08-11 DIAGNOSIS — C50919 Malignant neoplasm of unspecified site of unspecified female breast: Secondary | ICD-10-CM

## 2014-08-11 DIAGNOSIS — Z853 Personal history of malignant neoplasm of breast: Secondary | ICD-10-CM | POA: Insufficient documentation

## 2014-08-11 HISTORY — DX: Malignant neoplasm of unspecified site of unspecified female breast: C50.919

## 2014-08-28 ENCOUNTER — Inpatient Hospital Stay: Admission: RE | Admit: 2014-08-28 | Payer: Self-pay | Source: Ambulatory Visit

## 2014-10-14 ENCOUNTER — Other Ambulatory Visit: Payer: Self-pay | Admitting: *Deleted

## 2014-10-14 ENCOUNTER — Other Ambulatory Visit: Payer: Self-pay

## 2014-10-21 ENCOUNTER — Ambulatory Visit
Admission: RE | Admit: 2014-10-21 | Discharge: 2014-10-21 | Disposition: A | Discharge status: Home / Self Care | Payer: Medicare Other | Source: Ambulatory Visit | Attending: Psychiatry | Admitting: Psychiatry

## 2014-10-21 ENCOUNTER — Encounter: Payer: Self-pay | Admitting: Certified Registered Nurse Anesthetist

## 2014-10-21 ENCOUNTER — Encounter: Payer: Medicare Other | Admitting: Certified Registered Nurse Anesthetist

## 2014-10-21 DIAGNOSIS — F332 Major depressive disorder, recurrent severe without psychotic features: Secondary | ICD-10-CM | POA: Insufficient documentation

## 2014-10-21 DIAGNOSIS — M109 Gout, unspecified: Secondary | ICD-10-CM

## 2014-10-21 DIAGNOSIS — E7800 Pure hypercholesterolemia, unspecified: Secondary | ICD-10-CM

## 2014-10-21 HISTORY — DX: Pure hypercholesterolemia, unspecified: E78.00

## 2014-10-21 HISTORY — DX: Sleep apnea, unspecified: G47.30

## 2014-10-21 HISTORY — DX: Essential (primary) hypertension: I10

## 2014-10-21 HISTORY — DX: Hypothyroidism, unspecified: E03.9

## 2014-10-21 HISTORY — DX: Major depressive disorder, single episode, unspecified: F32.9

## 2014-10-21 HISTORY — DX: Anxiety disorder, unspecified: F41.9

## 2014-10-21 HISTORY — DX: Gout, unspecified: M10.9

## 2014-10-21 HISTORY — DX: Depression, unspecified: F32.A

## 2014-10-21 HISTORY — PX: HERNIA REPAIR: SHX51

## 2014-10-21 MED ORDER — SUCCINYLCHOLINE CHLORIDE 20 MG/ML IJ SOLN
150.0000 mg | Freq: Once | INTRAMUSCULAR | Status: AC
  Administered 2014-10-21: 150 mg via INTRAVENOUS

## 2014-10-21 MED ORDER — LABETALOL HCL 5 MG/ML IV SOLN
10.0000 mg | Freq: Once | INTRAVENOUS | Status: AC
  Administered 2014-10-21: 10 mg via INTRAVENOUS

## 2014-10-21 MED ORDER — DEXTROSE IN LACTATED RINGERS 5 % IV SOLN
INTRAVENOUS | Status: DC | PRN
  Administered 2014-10-21: 10:00:00 via INTRAVENOUS

## 2014-10-21 MED ORDER — KETAMINE HCL 10 MG/ML IJ SOLN
120.0000 mg | Freq: Once | INTRAMUSCULAR | Status: AC
  Administered 2014-10-21: 120 mg via INTRAVENOUS

## 2014-10-21 MED ORDER — DEXTROSE 5 % IV SOLN
250.0000 mL | Freq: Once | INTRAVENOUS | Status: AC
  Administered 2014-10-21: 250 mL via INTRAVENOUS

## 2014-10-21 MED ORDER — ONDANSETRON HCL 4 MG/2ML IJ SOLN: 4.0000 mg | Freq: Once | INTRAMUSCULAR | Status: AC | PRN

## 2014-10-21 MED ORDER — LABETALOL HCL 5 MG/ML IV SOLN
20.0000 mg | Freq: Once | INTRAVENOUS | Status: AC
  Administered 2014-10-21: 20 mg via INTRAVENOUS

## 2014-10-21 MED ORDER — FENTANYL CITRATE (PF) 100 MCG/2ML IJ SOLN: 25.0000 ug | INTRAMUSCULAR | Status: DC | PRN

## 2014-10-21 NOTE — Anesthesia Preprocedure Evaluation (Signed)
Anesthesia Evaluation  Patient identified by MRN, date of birth, ID band Patient awake and Patient confused  General Assessment Comment:Son available  Reviewed: Allergy & Precautions, NPO status , Patient's Chart, lab work & pertinent test results  Airway Mallampati: III  TM Distance: >3 FB     Dental  (+) Upper Dentures, Lower Dentures   Pulmonary neg pulmonary ROS, sleep apnea ,  breath sounds clear to auscultation  Pulmonary exam normal       Cardiovascular hypertension, Pt. on medications + CAD Normal cardiovascular exam    Neuro/Psych Anxiety Depression Pt very anxious today   GI/Hepatic   Endo/Other  negative endocrine ROS  Renal/GU Renal InsufficiencyRenal disease  negative genitourinary   Musculoskeletal  (+) Arthritis -, Osteoarthritis,    Abdominal Normal abdominal exam  (+) + obese,   Peds negative pediatric ROS (+)  Hematology   Anesthesia Other Findings Pt has a history of gout.Marland KitchenMarland KitchenThe patient has a history of of increased lipids  Reproductive/Obstetrics                             Anesthesia Physical Anesthesia Plan  ASA: III  Anesthesia Plan: General   Post-op Pain Management:    Induction: Intravenous  Airway Management Planned: Simple Face Mask  Additional Equipment:   Intra-op Plan:   Post-operative Plan:   Informed Consent: I have reviewed the patients History and Physical, chart, labs and discussed the procedure including the risks, benefits and alternatives for the proposed anesthesia with the patient or authorized representative who has indicated his/her understanding and acceptance.   Dental advisory given  Plan Discussed with: CRNA and Surgeon  Anesthesia Plan Comments:         Anesthesia Quick Evaluation

## 2014-10-21 NOTE — Procedures (Signed)
ECT SERVICES Physician's Interval Evaluation & Treatment Note  Patient Identification: Allison Gonzalez MRN:  [id number] Date of Evaluation:  10/21/2014 TX #: [id number]  MADRS: 25  MMSE: 22  P.E. Findings:  Patient appears to be tired however known significant specific new physical findings  Psychiatric Interval Note:  Mood is down. Very anxious. Feels very low ever since her husband passed. No active suicidal intent. Does not seem to be making as much of an effort to be active as she could be  Subjective:  Patient is a 81 y.o. female seen for evaluation for Electroconvulsive Therapy. Patient is very nervous and not the best historian. Much history from her son who is the power of attorney.  Treatment Summary:   [x]   Right Unilateral             []  Bilateral   % Energy : 0.3 ms 100%   Impedance: 990 ohms  Seizure Energy Index: 1854 V squared  Postictal Suppression Index: 93%  Seizure Concordance Index: 91%  Medications  Pre Shock: Labetalol 20 mg, ketamine 120 mg, succinylcholine 150 mg  Post Shock: None  Seizure Duration: 20 seconds by EMG, 58 seconds by EEG   Comments: Patient clearly remains depressed. Her son is working on trying to increase her physical activity. Bringing her in more frequently appears to be difficult. She has not been here in 3 months now. I'm going to put her down for follow-up in 4 weeks. If at that time she is still looking so depressed we may discuss the possible benefits of more frequent treatment.   Lungs:  [x]   Clear to auscultation               []  Other:   Heart:    [x]   Regular rhythm             []  irregular rhythm    [x]   Previous H&P reviewed, patient examined and there are NO CHANGES                 []   Previous H&P reviewed, patient examined and there are changes noted.   Alethia Berthold, MD 7/27/201611:15 AM

## 2014-10-21 NOTE — Transfer of Care (Signed)
Immediate Anesthesia Transfer of Care Note  Patient: Allison Gonzalez  Procedure(s) Performed: ECT Patient Location: PACU  Anesthesia Type:General  Level of Consciousness: sedated  Airway & Oxygen Therapy: Patient Spontanous Breathing and Patient connected to face mask oxygen  Post-op Assessment: Report given to RN and Post -op Vital signs reviewed and stable  Post vital signs: Reviewed and stable  Last Vitals:  Filed Vitals:   10/21/14 1134  BP: 172/91  Pulse: 73  Temp: 37.2 C  Resp: 18    Complications: No apparent anesthesia complications

## 2014-10-21 NOTE — Anesthesia Postprocedure Evaluation (Addendum)
  Anesthesia Post-op Note  Patient: Allison Gonzalez  Procedure(s) Performed: * No procedures listed *  Anesthesia type:General  Patient location: PACU  Post pain: Pain level controlled  Post assessment: Post-op Vital signs reviewed, Patient's Cardiovascular Status Stable, Respiratory Function Stable, Patent Airway and No signs of Nausea or vomiting  Post vital signs: Reviewed and stable  Last Vitals:  Filed Vitals:   10/21/14 1155  BP: 171/103  Pulse: 77  Temp:   Resp: 35    Level of consciousness: awake, alert  and patient cooperative  Complications: No apparent anesthesia complications Labetolol given for BP with last BP with a diastolic of 87

## 2014-10-21 NOTE — Anesthesia Procedure Notes (Signed)
Performed by: Demetrius Charity Pre-anesthesia Checklist: Patient identified, Patient being monitored, Timeout performed, Emergency Drugs available and Suction available Patient Re-evaluated:Patient Re-evaluated prior to inductionOxygen Delivery Method: Circle system utilized Preoxygenation: Pre-oxygenation with 100% oxygen Intubation Type: IV induction Ventilation: Mask ventilation without difficulty Airway Equipment and Method: Bite block Dental Injury: Teeth and Oropharynx as per pre-operative assessment

## 2014-11-02 ENCOUNTER — Emergency Department: Payer: Medicare Other

## 2014-11-02 ENCOUNTER — Inpatient Hospital Stay
Admission: EM | Admit: 2014-11-02 | Discharge: 2014-11-05 | DRG: 871 | Disposition: A | Discharge status: Home / Self Care | Payer: Medicare Other | Attending: Internal Medicine | Admitting: Internal Medicine

## 2014-11-02 ENCOUNTER — Encounter: Payer: Self-pay | Admitting: Emergency Medicine

## 2014-11-02 DIAGNOSIS — M19019 Primary osteoarthritis, unspecified shoulder: Secondary | ICD-10-CM | POA: Diagnosis present

## 2014-11-02 DIAGNOSIS — F419 Anxiety disorder, unspecified: Secondary | ICD-10-CM | POA: Diagnosis present

## 2014-11-02 DIAGNOSIS — Z9842 Cataract extraction status, left eye: Secondary | ICD-10-CM | POA: Diagnosis not present

## 2014-11-02 DIAGNOSIS — G4733 Obstructive sleep apnea (adult) (pediatric): Secondary | ICD-10-CM | POA: Diagnosis present

## 2014-11-02 DIAGNOSIS — F329 Major depressive disorder, single episode, unspecified: Secondary | ICD-10-CM | POA: Diagnosis present

## 2014-11-02 DIAGNOSIS — I517 Cardiomegaly: Secondary | ICD-10-CM | POA: Diagnosis present

## 2014-11-02 DIAGNOSIS — Z79899 Other long term (current) drug therapy: Secondary | ICD-10-CM

## 2014-11-02 DIAGNOSIS — I10 Essential (primary) hypertension: Secondary | ICD-10-CM | POA: Diagnosis present

## 2014-11-02 DIAGNOSIS — M1000 Idiopathic gout, unspecified site: Secondary | ICD-10-CM | POA: Diagnosis present

## 2014-11-02 DIAGNOSIS — E039 Hypothyroidism, unspecified: Secondary | ICD-10-CM | POA: Diagnosis present

## 2014-11-02 DIAGNOSIS — Z8249 Family history of ischemic heart disease and other diseases of the circulatory system: Secondary | ICD-10-CM | POA: Diagnosis not present

## 2014-11-02 DIAGNOSIS — Z9841 Cataract extraction status, right eye: Secondary | ICD-10-CM | POA: Diagnosis not present

## 2014-11-02 DIAGNOSIS — G9341 Metabolic encephalopathy: Secondary | ICD-10-CM | POA: Diagnosis present

## 2014-11-02 DIAGNOSIS — Z9889 Other specified postprocedural states: Secondary | ICD-10-CM

## 2014-11-02 DIAGNOSIS — E780 Pure hypercholesterolemia: Secondary | ICD-10-CM | POA: Diagnosis present

## 2014-11-02 DIAGNOSIS — N390 Urinary tract infection, site not specified: Secondary | ICD-10-CM | POA: Diagnosis present

## 2014-11-02 DIAGNOSIS — Z7982 Long term (current) use of aspirin: Secondary | ICD-10-CM

## 2014-11-02 DIAGNOSIS — E871 Hypo-osmolality and hyponatremia: Secondary | ICD-10-CM | POA: Diagnosis present

## 2014-11-02 DIAGNOSIS — Z87440 Personal history of urinary (tract) infections: Secondary | ICD-10-CM | POA: Diagnosis not present

## 2014-11-02 DIAGNOSIS — E785 Hyperlipidemia, unspecified: Secondary | ICD-10-CM | POA: Diagnosis present

## 2014-11-02 DIAGNOSIS — E860 Dehydration: Secondary | ICD-10-CM | POA: Diagnosis present

## 2014-11-02 DIAGNOSIS — B369 Superficial mycosis, unspecified: Secondary | ICD-10-CM | POA: Diagnosis present

## 2014-11-02 DIAGNOSIS — A419 Sepsis, unspecified organism: Principal | ICD-10-CM | POA: Diagnosis present

## 2014-11-02 DIAGNOSIS — K219 Gastro-esophageal reflux disease without esophagitis: Secondary | ICD-10-CM | POA: Diagnosis present

## 2014-11-02 DIAGNOSIS — Z880 Allergy status to penicillin: Secondary | ICD-10-CM | POA: Diagnosis not present

## 2014-11-02 DIAGNOSIS — C50919 Malignant neoplasm of unspecified site of unspecified female breast: Secondary | ICD-10-CM | POA: Diagnosis present

## 2014-11-02 DIAGNOSIS — Z853 Personal history of malignant neoplasm of breast: Secondary | ICD-10-CM

## 2014-11-02 DIAGNOSIS — Z87891 Personal history of nicotine dependence: Secondary | ICD-10-CM

## 2014-11-02 DIAGNOSIS — E119 Type 2 diabetes mellitus without complications: Secondary | ICD-10-CM | POA: Diagnosis present

## 2014-11-02 DIAGNOSIS — Z823 Family history of stroke: Secondary | ICD-10-CM | POA: Diagnosis not present

## 2014-11-02 DIAGNOSIS — B964 Proteus (mirabilis) (morganii) as the cause of diseases classified elsewhere: Secondary | ICD-10-CM | POA: Diagnosis present

## 2014-11-02 LAB — BASIC METABOLIC PANEL
Anion gap: 13 (ref 5–15)
BUN: 15 mg/dL (ref 6–20)
CO2: 22 mmol/L (ref 22–32)
Calcium: 10.1 mg/dL (ref 8.9–10.3)
Chloride: 96 mmol/L — ABNORMAL LOW (ref 101–111)
Creatinine, Ser: 1.02 mg/dL — ABNORMAL HIGH (ref 0.44–1.00)
GFR calc Af Amer: 57 mL/min — ABNORMAL LOW (ref >60)
GFR calc non Af Amer: 49 mL/min — ABNORMAL LOW (ref >60)
Glucose, Bld: 128 mg/dL — ABNORMAL HIGH (ref 65–99)
Potassium: 4.3 mmol/L (ref 3.5–5.1)
Sodium: 131 mmol/L — ABNORMAL LOW (ref 135–145)

## 2014-11-02 LAB — URINALYSIS COMPLETE WITH MICROSCOPIC (ARMC ONLY)
Bilirubin Urine: NEGATIVE
GLUCOSE, UA: NEGATIVE mg/dL
Nitrite: POSITIVE — AB
PH: 7 (ref 5.0–8.0)
PROTEIN: 100 mg/dL — AB
SPECIFIC GRAVITY, URINE: 1.02 (ref 1.005–1.030)

## 2014-11-02 LAB — CBC
HCT: 45.7 % (ref 35.0–47.0)
Hemoglobin: 14.9 g/dL (ref 12.0–16.0)
MCH: 29 pg (ref 26.0–34.0)
MCHC: 32.5 g/dL (ref 32.0–36.0)
MCV: 89.2 fL (ref 80.0–100.0)
Platelets: 305 10*3/uL (ref 150–440)
RBC: 5.12 MIL/uL (ref 3.80–5.20)
RDW: 15.4 % — AB (ref 11.5–14.5)
WBC: 16.1 10*3/uL — ABNORMAL HIGH (ref 3.6–11.0)

## 2014-11-02 LAB — LACTIC ACID, PLASMA
LACTIC ACID, VENOUS: 1.3 mmol/L (ref 0.5–2.0)
Lactic Acid, Venous: 1.9 mmol/L (ref 0.5–2.0)

## 2014-11-02 LAB — GLUCOSE, CAPILLARY: Glucose-Capillary: 126 mg/dL — ABNORMAL HIGH (ref 65–99)

## 2014-11-02 MED ORDER — SODIUM CHLORIDE 0.9 % IV SOLN
INTRAVENOUS | Status: DC
  Administered 2014-11-02 – 2014-11-04 (×3): via INTRAVENOUS

## 2014-11-02 MED ORDER — ALLOPURINOL 300 MG PO TABS
300.0000 mg | ORAL_TABLET | Freq: Every day | ORAL | Status: DC
  Administered 2014-11-03 – 2014-11-05 (×2): 300 mg via ORAL
  Filled 2014-11-02 (×3): qty 1

## 2014-11-02 MED ORDER — PANTOPRAZOLE SODIUM 40 MG PO TBEC
40.0000 mg | DELAYED_RELEASE_TABLET | Freq: Every day | ORAL | Status: DC
  Administered 2014-11-03 – 2014-11-05 (×2): 40 mg via ORAL
  Filled 2014-11-02 (×3): qty 1

## 2014-11-02 MED ORDER — ATORVASTATIN CALCIUM 10 MG PO TABS
10.0000 mg | ORAL_TABLET | Freq: Every day | ORAL | Status: DC
  Administered 2014-11-03 – 2014-11-05 (×2): 10 mg via ORAL
  Filled 2014-11-02 (×3): qty 1

## 2014-11-02 MED ORDER — LETROZOLE 2.5 MG PO TABS
2.5000 mg | ORAL_TABLET | Freq: Every day | ORAL | Status: DC
  Administered 2014-11-03 – 2014-11-05 (×2): 2.5 mg via ORAL
  Filled 2014-11-02 (×3): qty 1

## 2014-11-02 MED ORDER — LEVOTHYROXINE SODIUM 100 MCG PO TABS
200.0000 ug | ORAL_TABLET | Freq: Every day | ORAL | Status: DC
  Administered 2014-11-03 – 2014-11-05 (×3): 200 ug via ORAL
  Filled 2014-11-02 (×3): qty 2

## 2014-11-02 MED ORDER — FLUOXETINE HCL 20 MG PO CAPS
40.0000 mg | ORAL_CAPSULE | Freq: Every day | ORAL | Status: DC
  Administered 2014-11-03 – 2014-11-05 (×2): 40 mg via ORAL
  Filled 2014-11-02 (×3): qty 2

## 2014-11-02 MED ORDER — DOCUSATE SODIUM 100 MG PO CAPS
100.0000 mg | ORAL_CAPSULE | Freq: Two times a day (BID) | ORAL | Status: DC | PRN
  Administered 2014-11-05: 10:00:00 100 mg via ORAL
  Filled 2014-11-02: qty 1

## 2014-11-02 MED ORDER — SODIUM CHLORIDE 0.9 % IV BOLUS (SEPSIS)
1000.0000 mL | Freq: Once | INTRAVENOUS | Status: AC
  Administered 2014-11-02: 1000 mL via INTRAVENOUS

## 2014-11-02 MED ORDER — LEVOFLOXACIN IN D5W 500 MG/100ML IV SOLN
500.0000 mg | INTRAVENOUS | Status: DC
  Administered 2014-11-03: 18:00:00 500 mg via INTRAVENOUS
  Filled 2014-11-02 (×3): qty 100

## 2014-11-02 MED ORDER — ONDANSETRON HCL 4 MG/2ML IJ SOLN: 4.0000 mg | Freq: Four times a day (QID) | INTRAMUSCULAR | Status: DC | PRN

## 2014-11-02 MED ORDER — LEVOFLOXACIN IN D5W 750 MG/150ML IV SOLN
750.0000 mg | Freq: Once | INTRAVENOUS | Status: AC
  Administered 2014-11-02: 750 mg via INTRAVENOUS
  Filled 2014-11-02: qty 150

## 2014-11-02 MED ORDER — NYSTATIN 100000 UNIT/GM EX CREA
1.0000 "application " | TOPICAL_CREAM | Freq: Two times a day (BID) | CUTANEOUS | Status: DC
  Administered 2014-11-03 – 2014-11-05 (×6): 1 via TOPICAL
  Filled 2014-11-02: qty 15

## 2014-11-02 MED ORDER — ASPIRIN EC 81 MG PO TBEC
81.0000 mg | DELAYED_RELEASE_TABLET | Freq: Every day | ORAL | Status: DC
  Administered 2014-11-03 – 2014-11-05 (×2): 81 mg via ORAL
  Filled 2014-11-02 (×3): qty 1

## 2014-11-02 MED ORDER — ACETAMINOPHEN 325 MG PO TABS: 650.0000 mg | ORAL_TABLET | Freq: Four times a day (QID) | ORAL | Status: DC | PRN

## 2014-11-02 MED ORDER — ONDANSETRON HCL 4 MG PO TABS: 4.0000 mg | ORAL_TABLET | Freq: Four times a day (QID) | ORAL | Status: DC | PRN

## 2014-11-02 MED ORDER — SODIUM CHLORIDE 0.9 % IV BOLUS (SEPSIS)
2090.0000 mL | Freq: Once | INTRAVENOUS | Status: AC
  Administered 2014-11-02: 1000 mL via INTRAVENOUS

## 2014-11-02 MED ORDER — LORAZEPAM 0.5 MG PO TABS: 0.5000 mg | ORAL_TABLET | Freq: Three times a day (TID) | ORAL | Status: DC | PRN

## 2014-11-02 MED ORDER — POTASSIUM CHLORIDE CRYS ER 20 MEQ PO TBCR
20.0000 meq | EXTENDED_RELEASE_TABLET | Freq: Two times a day (BID) | ORAL | Status: DC
  Administered 2014-11-02 – 2014-11-05 (×5): 20 meq via ORAL
  Filled 2014-11-02 (×6): qty 1

## 2014-11-02 MED ORDER — LOSARTAN POTASSIUM 50 MG PO TABS
25.0000 mg | ORAL_TABLET | Freq: Every day | ORAL | Status: DC
  Administered 2014-11-03 – 2014-11-05 (×2): 25 mg via ORAL
  Filled 2014-11-02 (×3): qty 1

## 2014-11-02 MED ORDER — BUPROPION HCL ER (SR) 150 MG PO TB12
150.0000 mg | ORAL_TABLET | Freq: Every day | ORAL | Status: DC
  Administered 2014-11-03 – 2014-11-05 (×2): 150 mg via ORAL
  Filled 2014-11-02 (×3): qty 1

## 2014-11-02 MED ORDER — ENOXAPARIN SODIUM 40 MG/0.4ML ~~LOC~~ SOLN
40.0000 mg | SUBCUTANEOUS | Status: DC
  Administered 2014-11-02 – 2014-11-04 (×3): 40 mg via SUBCUTANEOUS
  Filled 2014-11-02 (×3): qty 0.4

## 2014-11-02 MED ORDER — NYSTATIN 100000 UNIT/GM EX POWD
Freq: Three times a day (TID) | CUTANEOUS | Status: DC
  Administered 2014-11-02 – 2014-11-05 (×9): via TOPICAL
  Filled 2014-11-02: qty 15

## 2014-11-02 MED ORDER — ACETAMINOPHEN 650 MG RE SUPP
650.0000 mg | Freq: Four times a day (QID) | RECTAL | Status: DC | PRN
Start: 2014-11-02 — End: 2014-11-05

## 2014-11-02 NOTE — H&P (Signed)
[facility] NAME: Allison Gonzalez    MR#:  [id number]  DATE OF BIRTH:  1930-02-29  DATE OF ADMISSION:  11/02/2014  PRIMARY CARE PHYSICIAN: Adrian Prows, MD   REQUESTING/REFERRING PHYSICIAN: Dr. Darrick Penna  CHIEF COMPLAINT:   Chief Complaint  Patient presents with  . Dizziness  . Fatigue   Altered mental status, weakness.  HISTORY OF PRESENT ILLNESS:  Allison Gonzalez  is a 81 y.o. female with a known history of breast cancer, hypertension, depression, anxiety, hypothyroidism, hyperlipidemia, obstructive sleep apnea who presented to the hospital due to altered mental status and weakness. Patient apparently had developed a rash on her buttocks this past week and went to the urgent care at [facility] and was diagnosed with a fungal rash and started on nystatin. She then today when she was out with her son became a bit confused and was lethargic. She was then brought to the ER for further evaluation. Patient was noted to have a leukocytosis of 16,000 and also noted to have urinary tract infection. Hospitalist services were contacted for further treatment and evaluation. Patient does complain of occasional dysuria, frequency.  PAST MEDICAL HISTORY:   Past Medical History  Diagnosis Date  . Breast cancer 2015    positive w/ radiation  . Hypertension   . Depression   . Anxiety   . Sleep apnea   . Hypothyroidism   . Gout of right foot 10/21/14  . Gout of right foot   . Hypercholesterolemia 10/21/14    PAST SURGICAL HISTORY:   Past Surgical History  Procedure Laterality Date  . Eye surgery    . Breast surgery    . Hernia repair Right 10/21/14    SOCIAL HISTORY:   History  Substance Use Topics  . Smoking status: Former Smoker    Types: Cigarettes    Quit date: 10/21/1974  . Smokeless tobacco: Not on file  . Alcohol Use: No    FAMILY HISTORY:   Family History  Problem Relation Age of Onset  .  Hypertension Son   . CVA Mother   . Heart disease Father     DRUG ALLERGIES:   Allergies  Allergen Reactions  . Penicillins Rash    REVIEW OF SYSTEMS:   Review of Systems  Constitutional: Negative for fever and weight loss.  HENT: Negative for congestion, nosebleeds and tinnitus.   Eyes: Negative for blurred vision, double vision and redness.  Respiratory: Negative for cough, hemoptysis and shortness of breath.   Cardiovascular: Negative for chest pain, orthopnea, leg swelling and PND.  Gastrointestinal: Negative for nausea, vomiting, abdominal pain, diarrhea and melena.  Genitourinary: Positive for dysuria and frequency. Negative for urgency and hematuria.  Musculoskeletal: Negative for joint pain and falls.  Neurological: Positive for weakness (generalized). Negative for dizziness, tingling, sensory change, focal weakness, seizures and headaches.  Endo/Heme/Allergies: Negative for polydipsia. Does not bruise/bleed easily.  Psychiatric/Behavioral: Negative for depression and memory loss. The patient is not nervous/anxious.     MEDICATIONS AT HOME:   Prior to Admission medications   Medication Sig Start Date End Date Taking? Authorizing Provider  allopurinol (ZYLOPRIM) 300 MG tablet Take 300 mg by mouth daily.   Yes Historical Provider, MD  aspirin EC 81 MG tablet Take 81 mg by mouth daily.   Yes Historical Provider, MD  atorvastatin (LIPITOR) 10 MG tablet Take 10 mg by mouth daily.   Yes Historical Provider, MD  buPROPion [facility] SR) 150 MG  12 hr tablet Take 150 mg by mouth daily.   Yes Historical Provider, MD  docusate sodium (COLACE) 100 MG capsule Take 100 mg by mouth 2 (two) times daily as needed for mild constipation.    Yes Historical Provider, MD  esomeprazole (NEXIUM) 40 MG capsule Take 40 mg by mouth daily at 12 noon.   Yes Historical Provider, MD  FLUoxetine (PROZAC) 40 MG capsule Take 40 mg by mouth daily.   Yes Historical Provider, MD  letrozole (FEMARA) 2.5 MG  tablet Take 2.5 mg by mouth daily.   Yes Historical Provider, MD  levothyroxine (SYNTHROID, LEVOTHROID) 200 MCG tablet Take 200 mcg by mouth daily before breakfast.   Yes Historical Provider, MD  LORazepam (ATIVAN) 0.5 MG tablet Take 0.5 mg by mouth every 8 (eight) hours as needed for anxiety.    Yes Historical Provider, MD  losartan (COZAAR) 25 MG tablet Take 25 mg by mouth daily.   Yes Historical Provider, MD  nystatin cream (MYCOSTATIN) Apply 1 application topically 2 (two) times daily.   Yes Historical Provider, MD  potassium chloride SA (K-DUR,KLOR-CON) 20 MEQ tablet Take 20 mEq by mouth 2 (two) times daily.   Yes Historical Provider, MD      VITAL SIGNS:  Blood pressure 132/78, pulse 94, temperature 98.7 F (37.1 C), temperature source Oral, resp. rate 22, height 5\' 8"  (1.727 m), weight 102.967 kg (227 lb), SpO2 96 %.  PHYSICAL EXAMINATION:  Physical Exam  GENERAL:  81 y.o.-year-old obese patient lying in the bed with no acute distress.  EYES: Pupils equal, round, reactive to light and accommodation. No scleral icterus. Extraocular muscles intact.  HEENT: Head atraumatic, normocephalic. Oropharynx and nasopharynx clear. No oropharyngeal erythema, moist oral mucosa  NECK:  Supple, no jugular venous distention. No thyroid enlargement, no tenderness.  LUNGS: Normal breath sounds bilaterally, no wheezing, rales, rhonchi. No use of accessory muscles of respiration.  CARDIOVASCULAR: S1, S2 RRR. No murmurs, rubs, gallops, clicks.  ABDOMEN: Soft, nontender, nondistended. Bowel sounds present. No organomegaly or mass.  EXTREMITIES: No pedal edema, cyanosis, or clubbing. + 2 pedal & radial pulses b/l.   NEUROLOGIC: Cranial nerves II through XII are intact. No focal Motor or sensory deficits appreciated b/l.  Globally weak. PSYCHIATRIC: The patient is alert and oriented x 3. Good affect.  SKIN: Fungal rash on the buttocks, ulcers on the buttocks.    LABORATORY PANEL:   CBC  Recent  Labs Lab 11/02/14 1427  WBC 16.1*  HGB 14.9  HCT 45.7  PLT 305   ------------------------------------------------------------------------------------------------------------------  Chemistries   Recent Labs Lab 11/02/14 1427  NA 131*  K 4.3  CL 96*  CO2 22  GLUCOSE 128*  BUN 15  CREATININE 1.02*  CALCIUM 10.1   ------------------------------------------------------------------------------------------------------------------  Cardiac Enzymes No results for input(s): TROPONINI in the last 168 hours. ------------------------------------------------------------------------------------------------------------------  RADIOLOGY:  Dg Chest 2 View  11/02/2014   CLINICAL DATA:  Dizziness, evaluate for pneumonia  EXAM: CHEST  2 VIEW  COMPARISON:  02/04/2013  FINDINGS: There is no focal parenchymal opacity. There is no pleural effusion or pneumothorax. There is mild stable cardiomegaly.  There is bilateral glenohumeral osteoarthritis.  IMPRESSION: No active cardiopulmonary disease.   Electronically Signed   By: Kathreen Devoid   On: 11/02/2014 17:49     IMPRESSION AND PLAN:   81 year old female with past medical history of hypertension, gout, obstructive sleep apnea, history of breast cancer, GERD, anxiety, depression who presented to the hospital with altered mental status and weakness  and noted to have a urinary tract infection.   #1 altered mental status-this is likely metabolic encephalopathy from the UTI. -Patient has been given IV fluids and IV Levaquin in the ER and her mental status is on a much improved according to her son.  #2 UTI-this is likely the cause of patient's altered mental status and leukocytosis. -We'll treat the patient with IV antibiotics and follow urine cultures. I will give her IV Levaquin.  #3 hyponatremia-mild and likely secondary to UTI/dehydration. -We'll give her gentle IV fluids and follow sodium.  #4 history of breast cancer-continue Femara  #5  hypothyroidism-continue Synthroid.  #6 hypertension-continue losartan.  #7 depression/anxiety-continue Wellbutrin.  #8 gout-continue allopurinol.   All the records are reviewed and case discussed with ED provider. Management plans discussed with the patient, family and they are in agreement.  CODE STATUS: Full  TOTAL TIME TAKING CARE OF THIS PATIENT: 45 minutes.    Henreitta Leber M.D on 11/02/2014 at 8:16 PM  Between 7am to 6pm - Pager - [phone]  After 6pm go to [email or web address] - password [email or web address] [facility]  [place] [facility]  Office  [phone]  CC: Primary care physician; Adrian Prows, MD

## 2014-11-02 NOTE — Plan of Care (Signed)
Problem: Discharge Progression Outcomes Goal: Discharge plan in place and appropriate Outcome: Progressing Individualization: 1. Lives at home with primary caregiver son; likes to be called Allison Gonzalez.. Pt requires assistance with ADL's and reportedly uses walker to take 5 steps. Family has lift at home for PRN use. 2. Pt was seen in [facility] today with skin rash care prescribed. Son reports pt had increased AMS and weakness with pt brought to ED. 3. Has history of depression with son reporting pt has depression effects more severe at times than others. 4. HIGH risk for fall- son reports has had at least 2 falls in past 25months. Fall protocol initiated- bed alarm, yellow protective footwear, education, personal items in hands reach, offer toileting/check pt every hour with rounds. 5. Medical history: history of breast cancer, HTN, depression, anxiety, hypothryoidism, gout controlled by home meds.

## 2014-11-02 NOTE — ED Provider Notes (Signed)
[facility] Emergency Department Provider Note  ____________________________________________  Time seen: Approximately 5:08 PM  I have reviewed the triage vital signs and the nursing notes.   HISTORY  Chief Complaint Dizziness and Fatigue    HPI Allison Gonzalez is a 81 y.o. female with depression, gout, arthritis, hyperlipidemia, hypertensionwho presents for evaluation of lightheadedness, transient altered mental status today, gradual onset, now improved. The patient's family at bedside reports that she was seen at a clinic earlier today for a rash on her blood tox. She was given "cream" for yeast. She was out running errands when she started to become lightheaded, son reports that she was "spacing out and needed me to shake her from time to time to wake her up ". No recent illness including no cough, sneezing, runny nose, congestion, vomiting, diarrhea, fevers, chills, no falls. Current severity of symptoms is mild.    Past Medical History  Diagnosis Date  . Breast cancer 2015    positive w/ radiation  . Hypertension   . Depression   . Anxiety   . Sleep apnea   . Hypothyroidism   . Gout of right foot 10/21/14  . Gout of right foot   . Hypercholesterolemia 10/21/14    Patient Active Problem List   Diagnosis Date Noted  . Severe recurrent major depression without psychotic features     Past Surgical History  Procedure Laterality Date  . Eye surgery    . Breast surgery    . Hernia repair Right 10/21/14    Current Outpatient Rx  Name  Route  Sig  Dispense  Refill  . allopurinol (ZYLOPRIM) 300 MG tablet   Oral   Take 300 mg by mouth daily.         Marland Kitchen aspirin 81 MG tablet   Oral   Take 81 mg by mouth daily.         Marland Kitchen atorvastatin (LIPITOR) 10 MG tablet   Oral   Take 10 mg by mouth daily.         Marland Kitchen buPROPion (WELLBUTRIN XL) 150 MG 24 hr tablet   Oral   Take 150 mg by mouth daily.         Marland Kitchen docusate sodium (COLACE) 100 MG capsule  Oral   Take 100 mg by mouth 2 (two) times daily.         Marland Kitchen FLUoxetine (PROZAC) 40 MG capsule   Oral   Take 40 mg by mouth daily.         Marland Kitchen HYDROcodone-acetaminophen (NORCO/VICODIN) 5-325 MG per tablet   Oral   Take 1 tablet by mouth every 6 (six) hours as needed for moderate pain.         Marland Kitchen letrozole (FEMARA) 2.5 MG tablet   Oral   Take 2.5 mg by mouth daily.         Marland Kitchen LORazepam (ATIVAN) 0.5 MG tablet   Oral   Take 0.5 mg by mouth every 8 (eight) hours.         Marland Kitchen losartan (COZAAR) 25 MG tablet   Oral   Take 25 mg by mouth daily.         . potassium chloride SA (K-DUR,KLOR-CON) 20 MEQ tablet   Oral   Take 20 mEq by mouth 2 (two) times daily.           Allergies Penicillins  Family History  Problem Relation Age of Onset  . Hypertension Son     Social History History  Substance Use Topics  . Smoking status: Former Smoker    Types: Cigarettes    Quit date: 10/21/1974  . Smokeless tobacco: Not on file  . Alcohol Use: No    Review of Systems Constitutional: No fever/chills Eyes: No visual changes. ENT: No sore throat. Cardiovascular: Denies chest pain. Respiratory: Denies shortness of breath. Gastrointestinal: No abdominal pain.  No nausea, no vomiting.  No diarrhea.  No constipation. Genitourinary: Negative for dysuria. Musculoskeletal: Negative for back pain. Skin: Negative for rash. Neurological: Negative for headaches, focal weakness or numbness.  10-point ROS otherwise negative.  ____________________________________________   PHYSICAL EXAM:  VITAL SIGNS: ED Triage Vitals  Enc Vitals Group     BP 11/02/14 1413 145/82 mmHg     Pulse Rate 11/02/14 1413 106     Resp 11/02/14 1413 20     Temp 11/02/14 1413 98.7 F (37.1 C)     Temp Source 11/02/14 1413 Oral     SpO2 11/02/14 1413 95 %     Weight 11/02/14 1413 227 lb (102.967 kg)     Height 11/02/14 1413 5\' 8"  (1.727 m)     Head Cir --      Peak Flow --      Pain Score --       Pain Loc --      Pain Edu? --      Excl. in [place]? --     Constitutional: Alert and oriented. Well appearing and in no acute distress. Smells of foul-smelling urine. Eyes: Conjunctivae are normal. PERRL. EOMI. Head: Atraumatic. Nose: No congestion/rhinnorhea. Mouth/Throat: Mucous membranes are moist.  Oropharynx non-erythematous. Neck: No stridor.   Cardiovascular: Mildly tachycardic rate, regular rhythm. Grossly normal heart sounds.  Good peripheral circulation. Respiratory: Normal respiratory effort.  No retractions. Lungs CTAB. Gastrointestinal: Soft and nontender. No distention. No abdominal bruits. No CVA tenderness. Genitourinary: deferred Musculoskeletal: No lower extremity tenderness nor edema.  No joint effusions. Neurologic:  Normal speech and language. No gross focal neurologic deficits are appreciated. 5 out of 5 strength in bilateral upper and lower extremities, sensation intact to light touch throughout. Skin:  Skin is warm, dry. Erythematous confluent rash of the buttocks with superficial decubitus ulcers in the sacrum. Psychiatric: Mood and affect are normal. Speech and behavior are normal.  ____________________________________________   LABS (all labs ordered are listed, but only abnormal results are displayed)  Labs Reviewed  BASIC METABOLIC PANEL - Abnormal; Notable for the following:    Sodium 131 (*)    Chloride 96 (*)    Glucose, Bld 128 (*)    Creatinine, Ser 1.02 (*)    GFR calc non Af Amer 49 (*)    GFR calc Af Amer 57 (*)    All other components within normal limits  CBC - Abnormal; Notable for the following:    WBC 16.1 (*)    RDW 15.4 (*)    All other components within normal limits  URINALYSIS COMPLETEWITH MICROSCOPIC ([facility] ONLY) - Abnormal; Notable for the following:    Color, Urine AMBER (*)    APPearance CLOUDY (*)    Ketones, ur TRACE (*)    Hgb urine dipstick 1+ (*)    Protein, ur 100 (*)    Nitrite POSITIVE (*)    Leukocytes, UA 1+ (*)     Bacteria, UA RARE (*)    Squamous Epithelial / LPF 0-5 (*)    All other components within normal limits  GLUCOSE, CAPILLARY - Abnormal; Notable for the following:  Glucose-Capillary 126 (*)    All other components within normal limits  CULTURE, BLOOD (ROUTINE X 2)  CULTURE, BLOOD (ROUTINE X 2)  URINE CULTURE  LACTIC ACID, PLASMA  LACTIC ACID, PLASMA  CBG MONITORING, ED   ____________________________________________  EKG  ED ECG REPORT I, Joanne Gavel, the attending physician, personally viewed and interpreted this ECG.   Date: 11/02/2014  EKG Time: 14:26  Rate: 104  Rhythm: sinus tachycardia  Axis: normal  Intervals:none  ST&T Change: No acute ST segment elevation.  ____________________________________________  RADIOLOGY  CXR FINDINGS: There is no focal parenchymal opacity. There is no pleural effusion or pneumothorax. There is mild stable cardiomegaly.  There is bilateral glenohumeral osteoarthritis.  IMPRESSION: No active cardiopulmonary disease. ____________________________________________   PROCEDURES  Procedure(s) performed: None  Critical Care performed: Yes, see critical care note(s). Total critical care time spent 30 minutes.  ____________________________________________   INITIAL IMPRESSION / ASSESSMENT AND PLAN / ED COURSE  Pertinent labs & imaging results that were available during my care of the patient were reviewed by me and considered in my medical decision making (see chart for details).  Allison Gonzalez is a 81 y.o. female with depression, gout, hyperlipidemia, hypertensionwho presents for evaluation of lightheadedness, transient altered mental status today. on exam, she is alert, oriented, intact neurological exam, nontoxic appearing. She is mildly tachycardic on arrival with leukocytosis meeting 2 out of 4 Sirs criteria. She has a large, rash on her backside which is most consistent with candidal rash. Will start topical nystatin.  Chest x-ray clear. She smells of urine my concern is for urinary tract infection. Awaiting urinalysis. We'll give fluids, antibiotics, anticipate admission.  ----------------------------------------- 7:46 PM on 11/02/2014 -----------------------------------------  Urine analysis consistent with nitrite positive urinary tract infection. We'll give Levaquin given history of allergic reaction to penicillins. Continue 30 mL/kg IV fluid bolus. Blood pressure stable. Case discussed with hospitalist for admission. ____________________________________________   FINAL CLINICAL IMPRESSION(S) / ED DIAGNOSES  Final diagnoses:  Sepsis secondary to UTI      Joanne Gavel, MD 11/02/14 [phone]

## 2014-11-02 NOTE — ED Notes (Signed)
Patient to ED with son who reports patient was seen earlier this morning for a rash/yeast infection. Was given medication, since that time they have been out running errands and patient began complaining of feeling weak and dizzy. Was told to come here due to possible infection.

## 2014-11-03 LAB — BASIC METABOLIC PANEL
ANION GAP: 8 (ref 5–15)
BUN: 15 mg/dL (ref 6–20)
CHLORIDE: 105 mmol/L (ref 101–111)
CO2: 24 mmol/L (ref 22–32)
Calcium: 9.4 mg/dL (ref 8.9–10.3)
Creatinine, Ser: 0.86 mg/dL (ref 0.44–1.00)
GFR calc Af Amer: >60 mL/min (ref >60)
GFR calc non Af Amer: >60 mL/min (ref >60)
GLUCOSE: 79 mg/dL (ref 65–99)
Potassium: 4.3 mmol/L (ref 3.5–5.1)
Sodium: 137 mmol/L (ref 135–145)

## 2014-11-03 LAB — CBC
HEMATOCRIT: 39.2 % (ref 35.0–47.0)
HEMOGLOBIN: 12.5 g/dL (ref 12.0–16.0)
MCH: 28.7 pg (ref 26.0–34.0)
MCHC: 31.8 g/dL — AB (ref 32.0–36.0)
MCV: 90.2 fL (ref 80.0–100.0)
PLATELETS: 241 10*3/uL (ref 150–440)
RBC: 4.35 MIL/uL (ref 3.80–5.20)
RDW: 15.4 % — ABNORMAL HIGH (ref 11.5–14.5)
WBC: 12.4 10*3/uL — ABNORMAL HIGH (ref 3.6–11.0)

## 2014-11-03 NOTE — Care Management (Signed)
Admitted to [facility] with the diagnosis of sepsis. Son, Shanon Brow lives with her in the home [phone]). No home health in the past. No skilled facility. States she has a rolling walker and a wheelchair in the home. Sees Dr. Ola Spurr. States she has an appointment soon. Son helps her with her baths. Son does the cooking and errands. Son will drive home. Physical therapy evaluation completed. Recommends home health and physical therapy. Declines home health at this time.  Shelbie Ammons RN MSN Care Management [phone]

## 2014-11-03 NOTE — Plan of Care (Addendum)
Individualization of Care Pt prefers to be called Allison Gonzalez Hx of Breast cancer, HTN, depression, anxiety, sleep apnea, hypothyroidism, Gout, hypercholesterolemia, fungal rash on buttocks.  Problem: Discharge Progression Outcomes Goal: Other Discharge Outcomes/Goals 1.  D/C plan:  Patient admitted for UTI and Sepsis.  Currently with elevated WBC.  Plan for possible discharge in 1-2 days if condition improves. 2.  Pain:  Patient without complaints of pain this shift. 3.  Hemodynamics:  Patient afebrile and VSS this shift.  BP 134/55 mmHg  Pulse 83  Temp(Src) 98 F (36.7 C) (Oral)  Resp 18  Ht 5\' 8"  (1.727 m)  Wt 216 lb 1.6 oz (98.022 kg)  BMI 32.87 kg/m2  SpO2 98% 4.  Complications:  Patient with fungal rash on buttocks treated with Nystatin cream and powder. 5.  Diet:  Patient on cardiac diet.  Family brought in ice cream and she had a 5 spoonfuls. 6.  Activity:  Patient in bed this shift.

## 2014-11-03 NOTE — Progress Notes (Signed)
[place] [facility] [place] NAME: Allison Gonzalez    MR#:  [id number]  DATE OF BIRTH:  10-20-1929  SUBJECTIVE:  CHIEF COMPLAINT:   Chief Complaint  Patient presents with  . Dizziness  . Fatigue    Have no complains, generalized weakness.  REVIEW OF SYSTEMS:  CONSTITUTIONAL: No fever,positive for fatigue or weakness.  EYES: No blurred or double vision.  EARS, NOSE, AND THROAT: No tinnitus or ear pain.  RESPIRATORY: No cough, shortness of breath, wheezing or hemoptysis.  CARDIOVASCULAR: No chest pain, orthopnea, edema.  GASTROINTESTINAL: No nausea, vomiting, diarrhea or abdominal pain.  GENITOURINARY: No dysuria, hematuria.  ENDOCRINE: No polyuria, nocturia,  HEMATOLOGY: No anemia, easy bruising or bleeding SKIN: No rash or lesion. MUSCULOSKELETAL: No joint pain or arthritis.   NEUROLOGIC: No tingling, numbness,  Positive for weakness.  PSYCHIATRY: No anxiety or depression.   ROS  DRUG ALLERGIES:   Allergies  Allergen Reactions  . Penicillins Rash    VITALS:  Blood pressure 138/63, pulse 84, temperature 98.2 F (36.8 C), temperature source Oral, resp. rate 18, height 5\' 8"  (1.727 m), weight 98.022 kg (216 lb 1.6 oz), SpO2 97 %.  PHYSICAL EXAMINATION:  GENERAL: 81 y.o.-year-old obese patient lying in the bed with no acute distress.  EYES: Pupils equal, round, reactive to light and accommodation. No scleral icterus. Extraocular muscles intact.  HEENT: Head atraumatic, normocephalic. Oropharynx and nasopharynx clear. No oropharyngeal erythema, moist oral mucosa  NECK: Supple, no jugular venous distention. No thyroid enlargement, no tenderness.  LUNGS: Normal breath sounds bilaterally, no wheezing, rales, rhonchi. No use of accessory muscles of respiration.  CARDIOVASCULAR: S1, S2 RRR. No murmurs, rubs, gallops, clicks.  ABDOMEN: Soft, nontender, nondistended. Bowel sounds present. No organomegaly or mass.  EXTREMITIES: No  pedal edema, cyanosis, or clubbing. + 2 pedal & radial pulses b/l.  NEUROLOGIC: Cranial nerves II through XII are intact. No focal Motor or sensory deficits appreciated b/l. Globally weak. PSYCHIATRIC: The patient is alert and oriented x 3. Good affect.  SKIN: Fungal rash on the buttocks, ulcers on the buttocks Physical Exam LABORATORY PANEL:   CBC  Recent Labs Lab 11/03/14 0438  WBC 12.4*  HGB 12.5  HCT 39.2  PLT 241   ------------------------------------------------------------------------------------------------------------------  Chemistries   Recent Labs Lab 11/03/14 0438  NA 137  K 4.3  CL 105  CO2 24  GLUCOSE 79  BUN 15  CREATININE 0.86  CALCIUM 9.4   ------------------------------------------------------------------------------------------------------------------  Cardiac Enzymes No results for input(s): TROPONINI in the last 168 hours. ------------------------------------------------------------------------------------------------------------------  RADIOLOGY:  Dg Chest 2 View  11/02/2014   CLINICAL DATA:  Dizziness, evaluate for pneumonia  EXAM: CHEST  2 VIEW  COMPARISON:  02/04/2013  FINDINGS: There is no focal parenchymal opacity. There is no pleural effusion or pneumothorax. There is mild stable cardiomegaly.  There is bilateral glenohumeral osteoarthritis.  IMPRESSION: No active cardiopulmonary disease.   Electronically Signed   By: Kathreen Devoid   On: 11/02/2014 17:49    ASSESSMENT AND PLAN:   # altered mental status-this is likely metabolic encephalopathy from the UTI. -Patient has been given IV fluids and IV Levaquin in the ER and her mental status is on a much improved   #2 UTI-this is likely the cause of patient's altered mental status and leukocytosis. - IV antibiotics and follow urine cultures.  IV Levaquin.  # hyponatremia-mild and likely secondary to UTI/dehydration. -We'll give her gentle IV fluids and follow sodium. Improved.  #  history of breast cancer-continue Femara  # hypothyroidism-continue Synthroid.  # hypertension-continue losartan.  # depression/anxiety-continue Wellbutrin.  # gout-continue allopurinol.  All the records are reviewed and case discussed with Care Management/Social Workerr. Management plans discussed with the patient, family and they are in agreement.  CODE STATUS: full  TOTAL TIME TAKING CARE OF THIS PATIENT: 35 minutes.     POSSIBLE D/C IN 1-2 DAYS, DEPENDING ON CLINICAL CONDITION.   Vaughan Basta M.D on 11/03/2014   Between 7am to 6pm - Pager - [phone]  After 6pm go to [email or web address] - password [email or web address] [facility]  [place]  Office  [phone]  CC: Primary care physician; Adrian Prows, MD

## 2014-11-03 NOTE — Plan of Care (Signed)
Problem: Discharge Progression Outcomes Goal: Other Discharge Outcomes/Goals Outcome: Progressing Plan of care progress to goal: Patient alert and oriented, she is still grieving from loss of her husband 4 months ago, they had been married since 1953 she states.  Patient with UTI, and yeast infection to groin area. Strong smell of urine when walk in room, on iv antibiotics, iv fluids. Patient labs monitored, WBC elevated. Lactic acid 1.3,  Hemodynamically stable, V/s wnl., afebrile Ambulates with walker just a few steps with assist, can turn in bed with minimal assist, she is incontinent of urine.

## 2014-11-03 NOTE — Evaluation (Signed)
Physical Therapy Evaluation Patient Details Name: Allison Gonzalez MRN: [id number] DOB: 06/02/1929 Today's Date: 11/03/2014   History of Present Illness  presented to ER secondary to dizziness, fatigue and AMS; admitted for metabolic encephalopathy/sepsis related to UTI.  Clinical Impression  Upon evaluation, patient alert and oriented to basic information.  Strength and ROM grossly WFL for basic transfers/mobility except bilat shoulder elevation (limited to approx 50 degrees due to chronic arthritic changes).  Currently requiring mod assist for bed mobility; min/mod assist for sit/stand and bed/chair transfer with RW.  Generally unsteady with tendency towards posterior LOB (requiring therapist assist for correction).  Additional gait not attempted as patient non-ambulatory at baseline (uses manual WC).  Recommend continued use of RW and +1 assist with all transfer attempts; patient reports son can provide upon discharge. Would benefit from skilled PT to address above deficits and promote optimal return to PLOF; Recommend transition to [place] upon discharge from acute hospitalization..     Follow Up Recommendations Home health PT    Equipment Recommendations  Rolling walker with 5" wheels    Recommendations for Other Services       Precautions / Restrictions Precautions Precautions: Fall Restrictions Weight Bearing Restrictions: No      Mobility  Bed Mobility Overal bed mobility: Needs Assistance Bed Mobility: Supine to Sit     Supine to sit: Mod assist     General bed mobility comments: difficulty pushing up with UEs due to shoulder pain  Transfers Overall transfer level: Needs assistance   Transfers: Sit to/from Stand Sit to Stand: Min assist         General transfer comment: pulls up on RW with bilat UEs; assist for lift off and anterior weight translation  Ambulation/Gait             General Gait Details: non-ambulatory at baseline  Stairs             Wheelchair Mobility    Modified Rankin (Stroke Patients Only)       Balance Overall balance assessment: Needs assistance Sitting-balance support: No upper extremity supported;Feet supported Sitting balance-Leahy Scale: Good     Standing balance support: Bilateral upper extremity supported Standing balance-Leahy Scale: Fair                               Pertinent Vitals/Pain Pain Assessment: No/denies pain    Home Living Family/patient expects to be discharged to:: Private residence Living Arrangements: Children Available Help at Discharge: Family Type of Home: House Home Access: Ramped entrance     Home Layout: One level Home Equipment: Environmental consultant - 4 wheels;Hospital bed;Wheelchair - manual (mechanical lift)      Prior Function Level of Independence: Needs assistance         Comments: Per patient reports, requires assist from son for all functional mobility and ADLs (unable to quantify).  Typically completes SPT between seating surfaces (with RW), and uses manual WC for additional household mobility.  Incontinent at baseline; son assists with hygiene.     Hand Dominance        Extremity/Trunk Assessment   Upper Extremity Assessment:  (bilat UE shoulder elevation limited to approx 50 degrees (chronic arthric changes))           Lower Extremity Assessment: Generalized weakness (grossly at least 3+ to 4-/5.  Decreased tolerance for resistance to L LE secondary to 'bad knee' (noted with varus deformity))  Communication      Cognition Arousal/Alertness: Awake/alert Behavior During Therapy: WFL for tasks assessed/performed Overall Cognitive Status: Within Functional Limits for tasks assessed                      General Comments      Exercises Other Exercises Other Exercises: Rolling bilat, sup, with heavy use of bedrails  Patient noted with incontinent bladder episode; dep for hygiene, linen change and clothing management  (8 minutes)      Assessment/Plan    PT Assessment Patient needs continued PT services  PT Diagnosis Difficulty walking;Generalized weakness   PT Problem List Decreased strength;Decreased range of motion;Decreased activity tolerance;Decreased balance;Decreased mobility;Decreased coordination;Decreased knowledge of use of DME;Cardiopulmonary status limiting activity;Decreased knowledge of precautions;Decreased safety awareness  PT Treatment Interventions DME instruction;Gait training;Stair training;Functional mobility training;Therapeutic activities;Balance training;Patient/family education;Therapeutic exercise   PT Goals (Current goals can be found in the Care Plan section) Acute Rehab PT Goals Patient Stated Goal: "to go back home" PT Goal Formulation: With patient Time For Goal Achievement: 11/17/14 Potential to Achieve Goals: Fair    Frequency Min 2X/week   Barriers to discharge        Co-evaluation               End of Session Equipment Utilized During Treatment: Gait belt Activity Tolerance: Patient tolerated treatment well Patient left: in chair;with call bell/phone within reach (chair alarm in place; box not available) Nurse Communication: Mobility status (patient position, assist recommended for return to bed)         Time: 0045-9977 PT Time Calculation (min) (ACUTE ONLY): 22 min   Charges:   PT Evaluation $Initial PT Evaluation Tier I: 1 Procedure PT Treatments $Therapeutic Activity: 8-22 mins   PT G Codes:       Emerie Vanderkolk H. Owens Shark, PT, DPT, NCS 11/03/2014, 2:43 PM [phone]

## 2014-11-03 NOTE — Plan of Care (Addendum)
Problem: Discharge Progression Outcomes Goal: Other Discharge Outcomes/Goals Outcome: Progressing Plan of care progress to goals: 1. C/o discomfort r/t buttocks/sacral irriation. Q2H turning provided & Nystatin cream & powder applied.  2. Hemodynamically:             -VSS, afebrile             -IVF infusing              -WBC 12.4.Marland KitchenMarland Kitchen IV Abx Q24H 3. Tolerating heart healthy diet well, just decreased appetite. Pt reports eating more today than yesterday.  4. Generalized weakness. PT worked w/ pt advising +1 assist w/ walker. Pt sat up in chair most of afternoon.

## 2014-11-03 NOTE — Progress Notes (Signed)
Initial Nutrition Assessment  INTERVENTION:   Meals and Snacks: Cater to patient preferences Medical Food Supplement Therapy: will recommend Mighty Shakes on meal trays TID for added nutrition (each shake provides 300kcals and 9g protein)   NUTRITION DIAGNOSIS:   Inadequate oral intake related to poor appetite as evidenced by meal completion < 25%, per patient/family report.  GOAL:   Patient will meet greater than or equal to 90% of their needs  MONITOR:    (Energy Intake, Electrolyte and Renal Profile, Anthropometrics, )  REASON FOR ASSESSMENT:   Malnutrition Screening Tool    ASSESSMENT:   Pt admitted with metabolic encephalopathy secondary to UTI.  Past Medical History  Diagnosis Date  . Breast cancer 2015    positive w/ radiation  . Hypertension   . Depression   . Anxiety   . Sleep apnea   . Hypothyroidism   . Gout of right foot 10/21/14  . Gout of right foot   . Hypercholesterolemia 10/21/14    Diet Order:  Diet Heart Room service appropriate?: Yes; Fluid consistency:: Thin    Current Nutrition: Pt had eaten 0% of breakfast this am. Pt reports 'I'm just not hungry.'  Food/Nutrition-Related History: Pt reports not eating anything yesterday either. Pt reports usually has a good appetite but couldn't clarify when it started to decrease other than yesterday. Pt reports not drinking supplements PTA.   Medications: NS at 175mL/hr, KCl, Protonix  Electrolyte/Renal Profile and Glucose Profile:   Recent Labs Lab 11/02/14 1427 11/03/14 0438  NA 131* 137  K 4.3 4.3  CL 96* 105  CO2 22 24  BUN 15 15  CREATININE 1.02* 0.86  CALCIUM 10.1 9.4  GLUCOSE 128* 79   Protein Profile: No results for input(s): ALBUMIN in the last 168 hours.  Gastrointestinal Profile: Last BM: unknown   Nutrition-Focused Physical Exam Findings: Nutrition-Focused physical exam completed. Findings are WDL for fat depletion, muscle depletion, and edema.    Weight Change: Pt  reports weight loss of 20lbs but unsure of UBW. Per [facility] encounters 5% weight loss in 2 weeks.   Skin:  Reviewed, no issues  Height:   Ht Readings from Last 1 Encounters:  11/02/14 5\' 8"  (1.727 m)    Weight:   Wt Readings from Last 1 Encounters:  11/02/14 216 lb 1.6 oz (98.022 kg)   Wt Readings from Last 10 Encounters:  11/02/14 216 lb 1.6 oz (98.022 kg)  10/21/14 227 lb (102.967 kg)    Ideal Body Weight:   63.6kg  BMI:  Body mass index is 32.87 kg/(m^2).  Estimated Nutritional Needs:   Kcal:  1629-1925kcals, BEE: 1140kcals, TEE: (IF 1.1-1.3)(AF 1.3) using IBW of 63.6kg  Protein:  51-70g protein (0.8-1.0g/kg) using IBW of 63.6kg  Fluid:  1590-1970mL of fluid (25-59mL/kg) using IBW of 63.6kg  EDUCATION NEEDS:   Education needs no appropriate at this time   [place], RD, LDN Pager [phone]

## 2014-11-03 NOTE — Progress Notes (Signed)
   11/03/14 1345  Clinical Encounter Type  Visited With Patient  Visit Type Initial  Provided pastoral presence and support to patient on the unit.  Allison Gonzalez Fredrik Mogel-pager [phone]

## 2014-11-04 LAB — GLUCOSE, CAPILLARY: Glucose-Capillary: 91 mg/dL (ref 65–99)

## 2014-11-04 MED ORDER — CIPROFLOXACIN HCL 500 MG PO TABS
500.0000 mg | ORAL_TABLET | Freq: Two times a day (BID) | ORAL | Status: DC
  Administered 2014-11-04 – 2014-11-05 (×3): 500 mg via ORAL
  Filled 2014-11-04 (×3): qty 1

## 2014-11-04 NOTE — Plan of Care (Signed)
Problem: Discharge Progression Outcomes Goal: Other Discharge Outcomes/Goals Plan of care progress to goals: 1. C/o discomfort r/t buttocks/sacral irriation. Q2H turning provided & Nystatin cream & powder applied.   2. Hemodynamically:             -VSS, afebrile               -PO Abx taken this afternoon             -refused to take AM medications 3. Refused breakfast & lunch w/ encouragement 4. Generalized weakness. Increased lethargy today. Pt oriented x4 but slow to respond. Continuously saying "I just don't know what is going on" MD aware of pt decline from yesterday.

## 2014-11-04 NOTE — Progress Notes (Addendum)
Pt son, Waunita Schooner whom the pt lives with came in tonight concerned w/ pt mental confusion. Waunita Schooner states she has severe depression and gets ECT treatments.   Contact Dave at [phone] or [phone] which are his son's numbers. Waunita Schooner does not have working phone currently.

## 2014-11-04 NOTE — Progress Notes (Signed)
Dr. Darvin Neighbours notified pt is more lethargic this morning stating "I just don't feel right". VSS, afebrile, IVF infusing as ordered. Neuro WDL. Flat affect continues. Refused medications & breakfast this AM. Dr. Darvin Neighbours to round soon. Will continue to assess.

## 2014-11-04 NOTE — Care Management Important Message (Signed)
Important Message  Patient Details  Name: Allison Gonzalez MRN: [id number] Date of Birth: 1929/12/18   Medicare Important Message Given:  Yes-second notification given    Juliann Pulse A Allmond 11/04/2014, 11:50 AM

## 2014-11-04 NOTE — Progress Notes (Signed)
[facility] [place] NAME: Allison Gonzalez    MR#:  [id number]  DATE OF BIRTH:  May 03, 1929  SUBJECTIVE:  CHIEF COMPLAINT:   Chief Complaint  Patient presents with  . Dizziness  . Fatigue    Have no complains, generalized weakness. Less alert today. Afebrile.  REVIEW OF SYSTEMS:  CONSTITUTIONAL: No fever,positive for fatigue or weakness.  EYES: No blurred or double vision.  EARS, NOSE, AND THROAT: No tinnitus or ear pain.  RESPIRATORY: No cough, shortness of breath, wheezing or hemoptysis.  CARDIOVASCULAR: No chest pain, orthopnea, edema.  GASTROINTESTINAL: No nausea, vomiting, diarrhea or abdominal pain.  GENITOURINARY: No dysuria, hematuria.  ENDOCRINE: No polyuria, nocturia,  HEMATOLOGY: No anemia, easy bruising or bleeding SKIN: No rash or lesion. MUSCULOSKELETAL: No joint pain or arthritis.   NEUROLOGIC: No tingling, numbness,  Positive for weakness.  PSYCHIATRY: No anxiety or depression.   ROS  DRUG ALLERGIES:   Allergies  Allergen Reactions  . Penicillins Rash    VITALS:  Blood pressure 155/78, pulse 85, temperature 98.3 F (36.8 C), temperature source Oral, resp. rate 18, height 5\' 8"  (1.727 m), weight 98.022 kg (216 lb 1.6 oz), SpO2 98 %.  PHYSICAL EXAMINATION:  GENERAL: 81 y.o.-year-old obese patient lying in the bed with no acute distress.  EYES: Pupils equal, round, reactive to light and accommodation. No scleral icterus. Extraocular muscles intact.  HEENT: Head atraumatic, normocephalic. Oropharynx and nasopharynx clear. No oropharyngeal erythema, moist oral mucosa  NECK: Supple, no jugular venous distention. No thyroid enlargement, no tenderness.  LUNGS: Normal breath sounds bilaterally, no wheezing, rales, rhonchi. No use of accessory muscles of respiration.  CARDIOVASCULAR: S1, S2 RRR. No murmurs, rubs, gallops, clicks.  ABDOMEN: Soft, nontender, nondistended. Bowel sounds present. No  organomegaly or mass.  EXTREMITIES: No pedal edema, cyanosis, or clubbing. + 2 pedal & radial pulses b/l.  NEUROLOGIC: Cranial nerves II through XII are intact. No focal Motor or sensory deficits appreciated b/l. Globally weak. PSYCHIATRIC: The patient is alert .  SKIN: Fungal rash on the buttocks, ulcers on the buttocks Physical Exam LABORATORY PANEL:   CBC  Recent Labs Lab 11/03/14 0438  WBC 12.4*  HGB 12.5  HCT 39.2  PLT 241   ------------------------------------------------------------------------------------------------------------------  Chemistries   Recent Labs Lab 11/03/14 0438  NA 137  K 4.3  CL 105  CO2 24  GLUCOSE 79  BUN 15  CREATININE 0.86  CALCIUM 9.4   ------------------------------------------------------------------------------------------------------------------  Cardiac Enzymes No results for input(s): TROPONINI in the last 168 hours. ------------------------------------------------------------------------------------------------------------------  RADIOLOGY:  Dg Chest 2 View  11/02/2014   CLINICAL DATA:  Dizziness, evaluate for pneumonia  EXAM: CHEST  2 VIEW  COMPARISON:  02/04/2013  FINDINGS: There is no focal parenchymal opacity. There is no pleural effusion or pneumothorax. There is mild stable cardiomegaly.  There is bilateral glenohumeral osteoarthritis.  IMPRESSION: No active cardiopulmonary disease.   Electronically Signed   By: Kathreen Devoid   On: 11/02/2014 17:49    ASSESSMENT AND PLAN:   * [place]us UTI with acute encephalopathy Slowly improving. Patient is not back to baseline and will continue IV antibiotics today. Likely discharge tomorrow  * Hypovolemic hyponatremia Improved with IV fluids.  # history of breast cancer-continue Femara  # hypothyroidism-continue Synthroid.  # hypertension-continue losartan.  # depression/anxiety-continue Wellbutrin.  # gout-continue allopurinol.  All the records are reviewed and case  discussed with Care Management/Social Workerr. Management plans discussed with the patient, family and they are  in agreement.  CODE STATUS: full  TOTAL TIME TAKING CARE OF THIS PATIENT: 35 minutes.    POSSIBLE D/C tomorrow, DEPENDING ON CLINICAL CONDITION.   Hillary Bow R M.D on 11/04/2014   Between 7am to 6pm - Pager - [phone]  After 6pm go to [email or web address] - password [email or web address] [facility]  [place] [facility]  Office  [phone]  CC: Primary care physician; Adrian Prows, MD

## 2014-11-05 MED ORDER — CIPROFLOXACIN HCL 500 MG PO TABS: 500.0000 mg | ORAL_TABLET | Freq: Two times a day (BID) | ORAL | Status: DC

## 2014-11-05 NOTE — Progress Notes (Signed)
Discharge information reviewed w/ patient and son, Waunita Schooner who cares for pt. Son shows understanding of discharge instructions and new medication. RN helped transport pt to car safely.

## 2014-11-05 NOTE — Plan of Care (Signed)
Individualization of Care Pt prefers to be called [place] Hx of Breast cancer, HTN, depression, anxiety, sleep apnea, hypothyroidism, Gout, hypercholesterolemia, fungal rash on buttocks.  Problem: Discharge Progression Outcomes Goal: Other Discharge Outcomes/Goals 1. D/C plan: Patient admitted for UTI and Sepsis. Currently with elevated WBC. Plan to discharge tomorrow 2. Pain: Patient without complaints of pain this shift. 3. Hemodynamics: Patient afebrile and VSS this shift. BP 134/81 mmHg  Pulse 100  Temp(Src) 98 F (36.7 C) (Oral)  Resp 18  Ht 5\' 8"  (1.727 m)  Wt 216 lb 1.6 oz (98.022 kg)  BMI 32.87 kg/m2  SpO2 98% 4. Complications: Patient with fungal rash on buttocks treated with Nystatin cream and powder.  Patient with delayed response and very flat affect this shift.  She said she feels lonely. 5. Diet: Patient on cardiac diet. She has been refusing to eat this shift.  Family brought in food and are worried about her not eating. 6. Activity: Patient in bed this shift.

## 2014-11-05 NOTE — Discharge Instructions (Signed)

## 2014-11-06 LAB — URINE CULTURE
Culture: >=100000
Special Requests: NORMAL

## 2014-11-08 ENCOUNTER — Emergency Department: Payer: Medicare Other

## 2014-11-08 ENCOUNTER — Encounter: Payer: Self-pay | Admitting: Intensive Care

## 2014-11-08 ENCOUNTER — Inpatient Hospital Stay
Admit: 2014-11-08 | Discharge: 2014-11-08 | Disposition: A | Discharge status: Home / Self Care | Payer: Medicare Other | Attending: Internal Medicine | Admitting: Internal Medicine

## 2014-11-08 ENCOUNTER — Inpatient Hospital Stay: Payer: Medicare Other

## 2014-11-08 ENCOUNTER — Inpatient Hospital Stay
Admission: EM | Admit: 2014-11-08 | Discharge: 2014-11-11 | DRG: 064 | Disposition: A | Discharge status: Skilled Nursing Facility | Payer: Medicare Other | Attending: Internal Medicine | Admitting: Internal Medicine

## 2014-11-08 DIAGNOSIS — Z8249 Family history of ischemic heart disease and other diseases of the circulatory system: Secondary | ICD-10-CM | POA: Diagnosis not present

## 2014-11-08 DIAGNOSIS — I10 Essential (primary) hypertension: Secondary | ICD-10-CM | POA: Diagnosis present

## 2014-11-08 DIAGNOSIS — Z853 Personal history of malignant neoplasm of breast: Secondary | ICD-10-CM

## 2014-11-08 DIAGNOSIS — Z79899 Other long term (current) drug therapy: Secondary | ICD-10-CM

## 2014-11-08 DIAGNOSIS — Z880 Allergy status to penicillin: Secondary | ICD-10-CM

## 2014-11-08 DIAGNOSIS — B9620 Unspecified Escherichia coli [E. coli] as the cause of diseases classified elsewhere: Secondary | ICD-10-CM | POA: Diagnosis present

## 2014-11-08 DIAGNOSIS — Z9889 Other specified postprocedural states: Secondary | ICD-10-CM

## 2014-11-08 DIAGNOSIS — E669 Obesity, unspecified: Secondary | ICD-10-CM | POA: Diagnosis present

## 2014-11-08 DIAGNOSIS — E039 Hypothyroidism, unspecified: Secondary | ICD-10-CM | POA: Diagnosis present

## 2014-11-08 DIAGNOSIS — C719 Malignant neoplasm of brain, unspecified: Secondary | ICD-10-CM | POA: Diagnosis not present

## 2014-11-08 DIAGNOSIS — Z6833 Body mass index (BMI) 33.0-33.9, adult: Secondary | ICD-10-CM | POA: Diagnosis not present

## 2014-11-08 DIAGNOSIS — F0390 Unspecified dementia without behavioral disturbance: Secondary | ICD-10-CM | POA: Diagnosis present

## 2014-11-08 DIAGNOSIS — C7931 Secondary malignant neoplasm of brain: Secondary | ICD-10-CM | POA: Diagnosis present

## 2014-11-08 DIAGNOSIS — B964 Proteus (mirabilis) (morganii) as the cause of diseases classified elsewhere: Secondary | ICD-10-CM | POA: Diagnosis present

## 2014-11-08 DIAGNOSIS — Z87891 Personal history of nicotine dependence: Secondary | ICD-10-CM

## 2014-11-08 DIAGNOSIS — G4730 Sleep apnea, unspecified: Secondary | ICD-10-CM | POA: Diagnosis present

## 2014-11-08 DIAGNOSIS — G9341 Metabolic encephalopathy: Secondary | ICD-10-CM | POA: Diagnosis present

## 2014-11-08 DIAGNOSIS — R4189 Other symptoms and signs involving cognitive functions and awareness: Secondary | ICD-10-CM

## 2014-11-08 DIAGNOSIS — E780 Pure hypercholesterolemia: Secondary | ICD-10-CM | POA: Diagnosis present

## 2014-11-08 DIAGNOSIS — R569 Unspecified convulsions: Secondary | ICD-10-CM | POA: Diagnosis present

## 2014-11-08 DIAGNOSIS — Z66 Do not resuscitate: Secondary | ICD-10-CM | POA: Diagnosis present

## 2014-11-08 DIAGNOSIS — Z7982 Long term (current) use of aspirin: Secondary | ICD-10-CM | POA: Diagnosis not present

## 2014-11-08 DIAGNOSIS — A419 Sepsis, unspecified organism: Secondary | ICD-10-CM | POA: Diagnosis not present

## 2014-11-08 DIAGNOSIS — F319 Bipolar disorder, unspecified: Secondary | ICD-10-CM | POA: Diagnosis present

## 2014-11-08 DIAGNOSIS — Z87440 Personal history of urinary (tract) infections: Secondary | ICD-10-CM

## 2014-11-08 DIAGNOSIS — D0510 Intraductal carcinoma in situ of unspecified breast: Secondary | ICD-10-CM | POA: Diagnosis present

## 2014-11-08 DIAGNOSIS — I6509 Occlusion and stenosis of unspecified vertebral artery: Secondary | ICD-10-CM | POA: Diagnosis present

## 2014-11-08 DIAGNOSIS — N390 Urinary tract infection, site not specified: Secondary | ICD-10-CM | POA: Diagnosis not present

## 2014-11-08 DIAGNOSIS — E119 Type 2 diabetes mellitus without complications: Secondary | ICD-10-CM | POA: Diagnosis present

## 2014-11-08 DIAGNOSIS — Z823 Family history of stroke: Secondary | ICD-10-CM | POA: Diagnosis not present

## 2014-11-08 DIAGNOSIS — N323 Diverticulum of bladder: Secondary | ICD-10-CM | POA: Diagnosis present

## 2014-11-08 DIAGNOSIS — Z170 Estrogen receptor positive status [ER+]: Secondary | ICD-10-CM | POA: Diagnosis not present

## 2014-11-08 DIAGNOSIS — Z923 Personal history of irradiation: Secondary | ICD-10-CM | POA: Diagnosis not present

## 2014-11-08 DIAGNOSIS — G939 Disorder of brain, unspecified: Secondary | ICD-10-CM | POA: Diagnosis present

## 2014-11-08 DIAGNOSIS — E785 Hyperlipidemia, unspecified: Secondary | ICD-10-CM | POA: Diagnosis present

## 2014-11-08 DIAGNOSIS — I6529 Occlusion and stenosis of unspecified carotid artery: Secondary | ICD-10-CM | POA: Diagnosis present

## 2014-11-08 DIAGNOSIS — C801 Malignant (primary) neoplasm, unspecified: Secondary | ICD-10-CM

## 2014-11-08 DIAGNOSIS — M109 Gout, unspecified: Secondary | ICD-10-CM | POA: Diagnosis present

## 2014-11-08 DIAGNOSIS — I639 Cerebral infarction, unspecified: Secondary | ICD-10-CM | POA: Diagnosis present

## 2014-11-08 DIAGNOSIS — G9389 Other specified disorders of brain: Secondary | ICD-10-CM

## 2014-11-08 DIAGNOSIS — R404 Transient alteration of awareness: Secondary | ICD-10-CM | POA: Diagnosis not present

## 2014-11-08 HISTORY — DX: Type 2 diabetes mellitus without complications: E11.9

## 2014-11-08 LAB — LACTIC ACID, PLASMA
LACTIC ACID, VENOUS: 1.3 mmol/L (ref 0.5–2.0)
LACTIC ACID, VENOUS: 1.1 mmol/L (ref 0.5–2.0)

## 2014-11-08 LAB — URINALYSIS COMPLETE WITH MICROSCOPIC (ARMC ONLY)
Bilirubin Urine: NEGATIVE
Glucose, UA: NEGATIVE mg/dL
Nitrite: NEGATIVE
PROTEIN: 30 mg/dL — AB
Specific Gravity, Urine: 1.025 (ref 1.005–1.030)
pH: 5 (ref 5.0–8.0)

## 2014-11-08 LAB — COMPREHENSIVE METABOLIC PANEL
ALT: 23 U/L (ref 14–54)
AST: 38 U/L (ref 15–41)
Albumin: 3.6 g/dL (ref 3.5–5.0)
Alkaline Phosphatase: 122 U/L (ref 38–126)
Anion gap: 11 (ref 5–15)
BUN: 19 mg/dL (ref 6–20)
CHLORIDE: 102 mmol/L (ref 101–111)
CO2: 26 mmol/L (ref 22–32)
Calcium: 10 mg/dL (ref 8.9–10.3)
Creatinine, Ser: 0.95 mg/dL (ref 0.44–1.00)
GFR, EST NON AFRICAN AMERICAN: 54 mL/min — AB (ref >60)
GLUCOSE: 104 mg/dL — AB (ref 65–99)
Potassium: 4.6 mmol/L (ref 3.5–5.1)
Sodium: 139 mmol/L (ref 135–145)
TOTAL PROTEIN: 7.6 g/dL (ref 6.5–8.1)
Total Bilirubin: 0.4 mg/dL (ref 0.3–1.2)

## 2014-11-08 LAB — APTT: aPTT: 29 seconds (ref 24–36)

## 2014-11-08 LAB — CBC WITH DIFFERENTIAL/PLATELET
Basophils Absolute: 0.1 10*3/uL (ref 0–0.1)
Basophils Relative: 1 %
EOS PCT: 1 %
Eosinophils Absolute: 0.1 10*3/uL (ref 0–0.7)
HCT: 45.5 % (ref 35.0–47.0)
HEMOGLOBIN: 14.5 g/dL (ref 12.0–16.0)
LYMPHS ABS: 3.6 10*3/uL (ref 1.0–3.6)
Lymphocytes Relative: 24 %
MCH: 28.9 pg (ref 26.0–34.0)
MCHC: 31.9 g/dL — AB (ref 32.0–36.0)
MCV: 90.5 fL (ref 80.0–100.0)
MONO ABS: 0.9 10*3/uL (ref 0.2–0.9)
Monocytes Relative: 6 %
NEUTROS ABS: 10.6 10*3/uL — AB (ref 1.4–6.5)
Neutrophils Relative %: 68 %
PLATELETS: 269 10*3/uL (ref 150–440)
RBC: 5.03 MIL/uL (ref 3.80–5.20)
RDW: 15.7 % — AB (ref 11.5–14.5)
WBC: 15.5 10*3/uL — ABNORMAL HIGH (ref 3.6–11.0)

## 2014-11-08 LAB — LIPASE, BLOOD: Lipase: 14 U/L — ABNORMAL LOW (ref 22–51)

## 2014-11-08 LAB — TROPONIN I

## 2014-11-08 LAB — PROTIME-INR
INR: 1.08
Prothrombin Time: 14.2 seconds (ref 11.4–15.0)

## 2014-11-08 MED ORDER — VANCOMYCIN HCL 10 G IV SOLR: 1250.0000 mg | INTRAVENOUS | Status: DC

## 2014-11-08 MED ORDER — SODIUM CHLORIDE 0.9 % IV SOLN
INTRAVENOUS | Status: DC
  Administered 2014-11-08 – 2014-11-10 (×3): via INTRAVENOUS

## 2014-11-08 MED ORDER — VANCOMYCIN HCL 10 G IV SOLR
1250.0000 mg | INTRAVENOUS | Status: DC
  Filled 2014-11-08 (×2): qty 1250

## 2014-11-08 MED ORDER — STROKE: EARLY STAGES OF RECOVERY BOOK
Freq: Once | Status: AC
  Administered 2014-11-08: 21:00:00

## 2014-11-08 MED ORDER — SENNOSIDES-DOCUSATE SODIUM 8.6-50 MG PO TABS
1.0000 | ORAL_TABLET | Freq: Every evening | ORAL | Status: DC | PRN
Start: 2014-11-08 — End: 2014-11-11

## 2014-11-08 MED ORDER — VANCOMYCIN HCL IN DEXTROSE 1-5 GM/200ML-% IV SOLN
1000.0000 mg | Freq: Once | INTRAVENOUS | Status: AC
  Administered 2014-11-08: 1000 mg via INTRAVENOUS
  Filled 2014-11-08: qty 200

## 2014-11-08 MED ORDER — LEVOFLOXACIN IN D5W 750 MG/150ML IV SOLN
750.0000 mg | Freq: Once | INTRAVENOUS | Status: AC
  Administered 2014-11-08: 750 mg via INTRAVENOUS
  Filled 2014-11-08: qty 150

## 2014-11-08 MED ORDER — ASPIRIN 300 MG RE SUPP
300.0000 mg | Freq: Every day | RECTAL | Status: DC
  Administered 2014-11-08 – 2014-11-11 (×4): 300 mg via RECTAL
  Filled 2014-11-08 (×4): qty 1

## 2014-11-08 MED ORDER — IOHEXOL 300 MG/ML  SOLN: 100.0000 mL | Freq: Once | INTRAMUSCULAR | Status: DC | PRN

## 2014-11-08 MED ORDER — LEVOFLOXACIN IN D5W 750 MG/150ML IV SOLN
750.0000 mg | INTRAVENOUS | Status: DC
  Administered 2014-11-09: 750 mg via INTRAVENOUS
  Filled 2014-11-08 (×3): qty 150

## 2014-11-08 MED ORDER — IOHEXOL 350 MG/ML SOLN
100.0000 mL | Freq: Once | INTRAVENOUS | Status: AC | PRN
  Administered 2014-11-08: 100 mL via INTRAVENOUS

## 2014-11-08 MED ORDER — HEPARIN SODIUM (PORCINE) 5000 UNIT/ML IJ SOLN
5000.0000 [IU] | Freq: Three times a day (TID) | INTRAMUSCULAR | Status: DC
  Administered 2014-11-08 – 2014-11-11 (×8): 5000 [IU] via SUBCUTANEOUS
  Filled 2014-11-08 (×8): qty 1

## 2014-11-08 MED ORDER — DEXTROSE 5 % IV SOLN
2.0000 g | Freq: Once | INTRAVENOUS | Status: AC
  Administered 2014-11-08: 2 g via INTRAVENOUS
  Filled 2014-11-08: qty 2

## 2014-11-08 MED ORDER — DEXTROSE 5 % IV SOLN
1.0000 g | Freq: Three times a day (TID) | INTRAVENOUS | Status: DC
  Administered 2014-11-08 – 2014-11-09 (×2): 1 g via INTRAVENOUS
  Filled 2014-11-08 (×4): qty 1

## 2014-11-08 MED ORDER — SODIUM CHLORIDE 0.9 % IV BOLUS (SEPSIS)
1000.0000 mL | INTRAVENOUS | Status: AC
  Administered 2014-11-08 (×3): 1000 mL via INTRAVENOUS

## 2014-11-08 NOTE — Progress Notes (Signed)
*  PRELIMINARY RESULTS* Echocardiogram 2D Echocardiogram has been performed.  Allison Gonzalez 11/08/2014, 6:59 PM

## 2014-11-08 NOTE — ED Notes (Signed)
Attempted to call report. Secretary relayed message that charge nurse and RN are tied up in a room right now and will call me back as soon as possible to get report.

## 2014-11-08 NOTE — ED Notes (Signed)
Chen MD notified of family decision to cancel DNR order and agrees to do so.Marland Kitchen

## 2014-11-08 NOTE — H&P (Signed)
[facility] NAME: Allison Gonzalez    MR#:  [id number]  DATE OF BIRTH:  21-Jan-1930  DATE OF ADMISSION:  11/08/2014  PRIMARY CARE PHYSICIAN: Adrian Prows, MD   REQUESTING/REFERRING PHYSICIAN: Hinda Kehr, MD  CHIEF COMPLAINT:   Chief Complaint  Patient presents with  . Recurrent UTI   unresponsiveness for 3 days.  HISTORY OF PRESENT ILLNESS:  Allison Gonzalez  is a 81 y.o. female with a known history of recurrent UTI, hypertension, hyperlipidemia and diabetes. The patient was just discharged to home from our hospital last week after treatment of a UTI. After discharge, she has been taking by mouth antibiotics. She was found less responsive 3 days ago, has had minimal oral intake. She is unresponsive today and closed her eyes. Urinalysis showed too numerous RBC and WBC. CAT scan of head show acute infarction. The patient was treated with Levaquin, Azactam and vancomycin.  PAST MEDICAL HISTORY:   Past Medical History  Diagnosis Date  . Breast cancer 2015    positive w/ radiation  . Hypertension   . Depression   . Anxiety   . Sleep apnea   . Hypothyroidism   . Gout of right foot 10/21/14  . Gout of right foot   . Hypercholesterolemia 10/21/14  . Diabetes mellitus without complication     PAST SURGICAL HISTORY:   Past Surgical History  Procedure Laterality Date  . Eye surgery    . Breast surgery    . Hernia repair Right 10/21/14    SOCIAL HISTORY:   Social History  Substance Use Topics  . Smoking status: Former Smoker    Types: Cigarettes    Quit date: 10/21/1974  . Smokeless tobacco: Not on file  . Alcohol Use: No    FAMILY HISTORY:   Family History  Problem Relation Age of Onset  . Hypertension Son   . CVA Mother   . Heart disease Father     DRUG ALLERGIES:   Allergies  Allergen Reactions  . Penicillins Rash    REVIEW OF SYSTEMS:  Unresponsive to pain and verbal stimuli, unable to obtain  ROS at this time.   MEDICATIONS AT HOME:   Prior to Admission medications   Medication Sig Start Date End Date Taking? Authorizing Provider  allopurinol (ZYLOPRIM) 300 MG tablet Take 300 mg by mouth daily.    Historical Provider, MD  aspirin EC 81 MG tablet Take 81 mg by mouth daily.    Historical Provider, MD  atorvastatin (LIPITOR) 10 MG tablet Take 10 mg by mouth daily.    Historical Provider, MD  buPROPion (WELLBUTRIN SR) 150 MG 12 hr tablet Take 150 mg by mouth daily.    Historical Provider, MD  ciprofloxacin (CIPRO) 500 MG tablet Take 1 tablet (500 mg total) by mouth 2 (two) times daily. 11/05/14   Srikar Sudini, MD  docusate sodium (COLACE) 100 MG capsule Take 100 mg by mouth 2 (two) times daily as needed for mild constipation.     Historical Provider, MD  esomeprazole (NEXIUM) 40 MG capsule Take 40 mg by mouth daily at 12 noon.    Historical Provider, MD  FLUoxetine (PROZAC) 40 MG capsule Take 40 mg by mouth daily.    Historical Provider, MD  letrozole (FEMARA) 2.5 MG tablet Take 2.5 mg by mouth daily.    Historical Provider, MD  levothyroxine (SYNTHROID, LEVOTHROID) 200 MCG tablet Take 200 mcg by mouth daily before breakfast.    Historical Provider,  MD  LORazepam (ATIVAN) 0.5 MG tablet Take 0.5 mg by mouth every 8 (eight) hours as needed for anxiety.     Historical Provider, MD  losartan (COZAAR) 25 MG tablet Take 25 mg by mouth daily.    Historical Provider, MD  nystatin cream (MYCOSTATIN) Apply 1 application topically 2 (two) times daily.    Historical Provider, MD  potassium chloride SA (K-DUR,KLOR-CON) 20 MEQ tablet Take 20 mEq by mouth 2 (two) times daily.    Historical Provider, MD      VITAL SIGNS:  Blood pressure 161/80, pulse 90, temperature 97.8 F (36.6 C), temperature source Rectal, resp. rate 18, weight 100.401 kg (221 lb 5.5 oz), SpO2 100 %.  PHYSICAL EXAMINATION:  GENERAL:  81 y.o.-year-old patient lying in the bed, unresponsive.  EYES:  the patient closed her eyes  Pupils equal, round, sluggish reactive to light while openning her eyes. HEENT: Head atraumatic, normocephalic.Unable to do oral exam.  NECK:  Supple, no jugular venous distention. No thyroid enlargement, no tenderness.  LUNGS: Normal breath sounds bilaterally, no wheezing, rales,rhonchi or crepitation. No use of accessory muscles of respiration.  CARDIOVASCULAR: S1, S2 normal. No murmurs, rubs, or gallops.  ABDOMEN: Soft, nontender, nondistended. Bowel sounds present. No organomegaly or mass.  EXTREMITIES: No pedal edema, cyanosis, or clubbing.  NEUROLOGIC:Unable to exam due to unresponsiveness.Marland Kitchen  SKIN: No obvious rash, lesion, or ulcer.   LABORATORY PANEL:   CBC  Recent Labs Lab 11/08/14 0854  WBC 15.5*  HGB 14.5  HCT 45.5  PLT 269   ------------------------------------------------------------------------------------------------------------------  Chemistries   Recent Labs Lab 11/08/14 0854  NA 139  K 4.6  CL 102  CO2 26  GLUCOSE 104*  BUN 19  CREATININE 0.95  CALCIUM 10.0  AST 38  ALT 23  ALKPHOS 122  BILITOT 0.4   ------------------------------------------------------------------------------------------------------------------  Cardiac Enzymes  Recent Labs Lab 11/08/14 0854  TROPONINI <0.03   ------------------------------------------------------------------------------------------------------------------  RADIOLOGY:  Ct Head Wo Contrast  11/08/2014   CLINICAL DATA:  Head injury and loss of consciousness after fall.  EXAM: CT HEAD WITHOUT CONTRAST  CT CERVICAL SPINE WITHOUT CONTRAST  TECHNIQUE: Multidetector CT imaging of the head and cervical spine was performed following the standard protocol without intravenous contrast. Multiplanar CT image reconstructions of the cervical spine were also generated.  COMPARISON:  None.  FINDINGS: CT HEAD FINDINGS  Bony calvarium appears intact. Sulcal effacement and large area of low density is seen involving the right  posterior parietal cortex concerning for acute infarction. No midline shift is noted. Ventricular size is within normal limits. No hemorrhage is noted.  CT CERVICAL SPINE FINDINGS  Reversal of normal lordosis of cervical spine is noted, most likely due to severe degenerative disc disease present at C4-5, C5-6 and C6-7. Moderate degenerative disc disease is also present at C3-4. No fracture or spondylolisthesis is noted. Mild bilateral posterior facet joint hypertrophy is noted in the upper cervical spine. Visualized lung apices appear normal.  IMPRESSION: Probable large area of acute infarction involving the right posterior parietal cortex. MRI may be performed for further evaluation.  Severe multilevel degenerative disc disease is noted in the cervical spine. No fracture or spondylolisthesis is noted.   Electronically Signed   By: Marijo Conception, M.D.   On: 11/08/2014 10:32   Ct Cervical Spine Wo Contrast  11/08/2014   CLINICAL DATA:  Head injury and loss of consciousness after fall.  EXAM: CT HEAD WITHOUT CONTRAST  CT CERVICAL SPINE WITHOUT CONTRAST  TECHNIQUE: Multidetector CT  imaging of the head and cervical spine was performed following the standard protocol without intravenous contrast. Multiplanar CT image reconstructions of the cervical spine were also generated.  COMPARISON:  None.  FINDINGS: CT HEAD FINDINGS  Bony calvarium appears intact. Sulcal effacement and large area of low density is seen involving the right posterior parietal cortex concerning for acute infarction. No midline shift is noted. Ventricular size is within normal limits. No hemorrhage is noted.  CT CERVICAL SPINE FINDINGS  Reversal of normal lordosis of cervical spine is noted, most likely due to severe degenerative disc disease present at C4-5, C5-6 and C6-7. Moderate degenerative disc disease is also present at C3-4. No fracture or spondylolisthesis is noted. Mild bilateral posterior facet joint hypertrophy is noted in the upper  cervical spine. Visualized lung apices appear normal.  IMPRESSION: Probable large area of acute infarction involving the right posterior parietal cortex. MRI may be performed for further evaluation.  Severe multilevel degenerative disc disease is noted in the cervical spine. No fracture or spondylolisthesis is noted.   Electronically Signed   By: Marijo Conception, M.D.   On: 11/08/2014 10:32   Ct Abdomen Pelvis W Contrast  11/08/2014   CLINICAL DATA:  81 year old female found unresponsive. Sepsis. Recent urinary tract infection. Initial encounter.  EXAM: CT ABDOMEN AND PELVIS WITH CONTRAST  TECHNIQUE: Multidetector CT imaging of the abdomen and pelvis was performed using the standard protocol following bolus administration of intravenous contrast.  CONTRAST:  163mL OMNIPAQUE IOHEXOL 350 MG/ML SOLN  COMPARISON:  Lumbar MRI 02/01/2013.  FINDINGS: Cardiomegaly. No pericardial effusion. Bilateral lung base scarring or atelectasis. No pleural effusion or lung base consolidation.  Advanced degenerative changes in the spine. Osteopenia. No acute osseous abnormality identified.  No pelvic free fluid. Negative uterus and adnexa. Negative rectum aside from mild retained stool.  The bladder is mildly distended and there is perivesical stranding (series 2, image 76). There is a 2-3 cm diverticulum at the bladder dome (series 2, image 69 and sagittal image 87). Negative distal left ureters. No gas within the bladder.  Large body habitus and laxity of the ventral abdominal wall. Redundant sigmoid colon. Proximal sigmoid diverticulosis but no active inflammation. Negative left colon. Negative transverse colon. Redundant right colon. The entire right lateral abdomen wall is not included due to large body habitus. No large bowel inflammation identified. Negative terminal ileum. Appendix not identified. No dilated small bowel. No ventral abdominal hernia identified. Postoperative changes to the right ventral abdominal wall.  Negative stomach.  Liver, spleen, pancreas (atrophied), and adrenal glands are within normal limits. The gallbladder appears to be surgically absent. The portal venous system is patent. Extensive Aortoiliac calcified atherosclerosis noted. Major arterial structures are patent. No abdominal free fluid. No lymphadenopathy. Nonobstructing right midpole 11 mm calculus. Otherwise normal for age kidneys. Renal contrast enhancement and excretion is symmetric.  There is a 4.5 cm round and spiculated intermediate density (40 Hounsfield units) right breast mass. See coronal image 83.  IMPRESSION: 1. Bladder wall inflammation compatible with urinary infection. 2-3 cm bladder fundus diverticulum. Kidneys are negative aside from nonobstructing right nephrolithiasis. 2. 4.5 cm right breast mass. Recommend mammographic correlation once the patient recovers from the acute illness. 3. No other acute or inflammatory process in the abdomen or pelvis. 4. Extensive calcified aortic atherosclerosis. Extensive spine degeneration.   Electronically Signed   By: Genevie Ann M.D.   On: 11/08/2014 11:52   Dg Chest Port 1 View  11/08/2014   CLINICAL DATA:  Sepsis.  EXAM: PORTABLE CHEST - 1 VIEW  COMPARISON:  November 02, 2014.  FINDINGS: Stable cardiomegaly. No pneumothorax or significant pleural effusion is noted. Right lung is clear. Mild left basilar opacity is noted concerning for subsegmental atelectasis or scarring. Severe degenerative changes seen involving the glenohumeral joints bilaterally.  IMPRESSION: Mild left basilar subsegmental atelectasis or scarring. Stable cardiomegaly.   Electronically Signed   By: Marijo Conception, M.D.   On: 11/08/2014 09:58    EKG:   Orders placed or performed during the hospital encounter of 11/02/14  . ED EKG  . ED EKG  . EKG 12-Lead  . EKG 12-Lead  . EKG    IMPRESSION AND PLAN:   Acute encephalopathy Acute CVA Sepsis UTI Hypertension Hyperlipidemia History of breast cancer with right  breat mass ( 4.5 cm).  The patient will be admitted to medical floor with telemetry monitor. I will start CVA protocol. Since the patient is unresponsive, I will start aspirin 300 mg per rectal daily, may start statin po after speech study. Start neuro check, get MRI and MRA of the brain, echocardiogram, and carotid duplex. I will request a neurologist consult, get PT, OT and speech study. Fall, aspiration and seizure precaution. I will start IV fluid to support. For sepsis with UTI, the patient was treated with the Levaquin, Azactam and vancomycin in ED, I will continue with antibiotics, follow-up CBC, blood culture and urine culture. For breast mass, the patient may need further imaging study after the patient is stable, maybe as outpatient.  All the records are reviewed and case discussed with ED provider. Management plans discussed with the patient's son (POA). He is in agreement and  wanted the patient in DO NOT RESUSCITATE status.But  just now he changed mind and wants full code.  CODE STATUS:Full code  TOTAL TIME TAKING CARE OF THIS PATIENT: 62 minutes.    Demetrios Loll M.D on 11/08/2014 at 12:46 PM  Between 7am to 6pm - Pager - [phone]  After 6pm go to [email or web address] - password [email or web address] [facility]  [facility]  Office  [phone]  CC: Primary care physician; Adrian Prows, MD

## 2014-11-08 NOTE — Progress Notes (Signed)
ANTIBIOTIC CONSULT NOTE - INITIAL  Pharmacy Consult for Vancomycin, Azactam, and Levofloxacin  Indication: rule out sepsis  Allergies  Allergen Reactions  . Penicillins Rash    Patient Measurements: Weight: 221 lb 5.5 oz (100.401 kg) Adjusted Body Weight: 78.5 kg   Vital Signs: Temp: 97.8 F (36.6 C) (08/14 0906) Temp Source: Rectal (08/14 0906) BP: 157/84 mmHg (08/14 0930) Pulse Rate: 94 (08/14 0930) Intake/Output from previous day:   Intake/Output from this shift:    Labs:  Recent Labs  11/08/14 0854  WBC 15.5*  HGB 14.5  PLT 269  CREATININE 0.95   Estimated Creatinine Clearance: 55.6 mL/min (by C-G formula based on Cr of 0.95). No results for input(s): VANCOTROUGH, VANCOPEAK, VANCORANDOM, GENTTROUGH, GENTPEAK, GENTRANDOM, TOBRATROUGH, TOBRAPEAK, TOBRARND, AMIKACINPEAK, AMIKACINTROU, AMIKACIN in the last 72 hours.   Microbiology: Recent Results (from the past 720 hour(s))  Blood culture (routine x 2)     Status: None (Preliminary result)   Collection Time: 11/02/14  6:21 PM  Result Value Ref Range Status   Specimen Description BLOOD RIGHT HAND  Final   Special Requests BAA,5ML,ANA,AER  Final   Culture NO GROWTH 4 DAYS  Final   Report Status PENDING  Incomplete  Blood culture (routine x 2)     Status: None (Preliminary result)   Collection Time: 11/02/14  6:21 PM  Result Value Ref Range Status   Specimen Description BLOOD LEFT ARM  Final   Special Requests BAA,10ML,ANA,AER  Final   Culture NO GROWTH 4 DAYS  Final   Report Status PENDING  Incomplete  Urine culture     Status: None   Collection Time: 11/02/14  6:21 PM  Result Value Ref Range Status   Specimen Description URINE, RANDOM  Final   Special Requests Normal  Final   Culture   Final    >=100,000 COLONIES/mL PROTEUS MIRABILIS >=100,000 COLONIES/mL ESCHERICHIA COLI    Report Status 11/06/2014 FINAL  Final   Organism ID, Bacteria PROTEUS MIRABILIS  Final   Organism ID, Bacteria ESCHERICHIA COLI   Final      Susceptibility   Escherichia coli - MIC*    AMPICILLIN 4 SENSITIVE Sensitive     CEFTAZIDIME <=1 SENSITIVE Sensitive     CEFAZOLIN <=4 SENSITIVE Sensitive     CEFTRIAXONE <=1 SENSITIVE Sensitive     CIPROFLOXACIN <=0.25 SENSITIVE Sensitive     GENTAMICIN <=1 SENSITIVE Sensitive     IMIPENEM <=0.25 SENSITIVE Sensitive     TRIMETH/SULFA <=20 SENSITIVE Sensitive     NITROFURANTOIN Value in next row Sensitive      SENSITIVE<=16    PIP/TAZO Value in next row Sensitive      SENSITIVE<=4    LEVOFLOXACIN Value in next row Sensitive      SENSITIVE<=0.12    * >=100,000 COLONIES/mL ESCHERICHIA COLI   Proteus mirabilis - MIC*    AMPICILLIN Value in next row Sensitive      SENSITIVE<=0.12    CEFTAZIDIME Value in next row Sensitive      SENSITIVE<=0.12    CEFAZOLIN Value in next row Sensitive      SENSITIVE<=0.12    CEFTRIAXONE Value in next row Sensitive      SENSITIVE<=0.12    CIPROFLOXACIN Value in next row Sensitive      SENSITIVE<=0.12    GENTAMICIN Value in next row Sensitive      SENSITIVE<=0.12    IMIPENEM Value in next row Sensitive      SENSITIVE<=0.12    TRIMETH/SULFA Value in next row Sensitive  SENSITIVE<=0.12    NITROFURANTOIN Value in next row Resistant      RESISTANT128    PIP/TAZO Value in next row Sensitive      SENSITIVE<=4    AMPICILLIN/SULBACTAM Value in next row Sensitive      SENSITIVE<=2    * >=100,000 COLONIES/mL PROTEUS MIRABILIS    Medical History: Past Medical History  Diagnosis Date  . Breast cancer 2015    positive w/ radiation  . Hypertension   . Depression   . Anxiety   . Sleep apnea   . Hypothyroidism   . Gout of right foot 10/21/14  . Gout of right foot   . Hypercholesterolemia 10/21/14  . Diabetes mellitus without complication     Medications:  Scheduled:   Assessment: Patient being treated empirically for sepsis   Goal of Therapy:  Vancomycin trough level 15-20 mcg/ml  Plan:   Kel (hr-1): 0.042 Half-life (hrs):  16.50 Vd (liters): 70.28 (factor used: 0.7 L/kg)  Patient receive Vancomycin, Azactam, and Levofloxacin in ED.  1) Vancomycin 1 g was given @~0930. Will start Vancomycin 1250 mg IV q18 hours @ ~1500.  Vancomycin level ordered for 8/16 @ 2030.  2)Azactam: Will start Azactam 1 g IV q8 hours  3) Levofloxacin: Will start Levofloxacin 750 mg IV q24 hours on 8/15.  Follow up culture results  Jazzmyn Filion D 11/08/2014,11:59 AM

## 2014-11-08 NOTE — ED Notes (Signed)
Patient arrived by EMS from home. Patient was last seen normal, A&O last night. This morning patient was not responding to son so he called EMS. Patient was treated here last week for  UTI. Son said she finished her medicine for UTI. Son reports patient has had a decrease in fluid intake. FSBS per EMS 98

## 2014-11-08 NOTE — ED Provider Notes (Signed)
[facility] Emergency Department Provider Note  ____________________________________________  Time seen: Approximately 8:50 AM  I have reviewed the triage vital signs and the nursing notes.   HISTORY  Chief Complaint Recurrent UTI  History is limited by the patient's unresponsiveness  HPI Allison Gonzalez is a 81 y.o. female with extensive past medical history and admission to this hospital last week for sepsis secondary to a polymicrobial urinary tract infection.  She presents today by EMS after her son found her unresponsive at home.  This is similar to her presentation last week.  A discharge summary is not in the system at this time so I am not certain about the Hospital course, but her recent urine cultures show Proteus and Escherichia coli infection which were both sensitive to ceftriaxone.  No additional history can be obtained from the patient as she will not respond.  Her eyes flutter and when I manually open them she looked at me, but she is not speaking and her eyes immediately closed.  She will grimace to pain but otherwise does not respond.  She is protecting her airway and has an oxygen saturation of 96% on room air.  Her blood pressure indicates that she is hypertensive at this time and hemodynamically stable.   Past Medical History  Diagnosis Date  . Breast cancer 2015    positive w/ radiation  . Hypertension   . Depression   . Anxiety   . Sleep apnea   . Hypothyroidism   . Gout of right foot 10/21/14  . Gout of right foot   . Hypercholesterolemia 10/21/14  . Diabetes mellitus without complication     Patient Active Problem List   Diagnosis Date Noted  . Acute CVA (cerebrovascular accident) 11/08/2014  . Sepsis 11/02/2014  . Severe recurrent major depression without psychotic features     Past Surgical History  Procedure Laterality Date  . Eye surgery    . Breast surgery    . Hernia repair Right 10/21/14    Current Outpatient Rx   Name  Route  Sig  Dispense  Refill  . allopurinol (ZYLOPRIM) 300 MG tablet   Oral   Take 300 mg by mouth daily.         Marland Kitchen aspirin EC 81 MG tablet   Oral   Take 81 mg by mouth daily.         Marland Kitchen atorvastatin (LIPITOR) 10 MG tablet   Oral   Take 10 mg by mouth daily.         Marland Kitchen buPROPion (WELLBUTRIN SR) 150 MG 12 hr tablet   Oral   Take 150 mg by mouth daily.         . ciprofloxacin (CIPRO) 500 MG tablet   Oral   Take 1 tablet (500 mg total) by mouth 2 (two) times daily.   6 tablet   0   . docusate sodium (COLACE) 100 MG capsule   Oral   Take 100 mg by mouth 2 (two) times daily as needed for mild constipation.          Marland Kitchen esomeprazole (NEXIUM) 40 MG capsule   Oral   Take 40 mg by mouth daily at 12 noon.         Marland Kitchen FLUoxetine (PROZAC) 40 MG capsule   Oral   Take 40 mg by mouth daily.         Marland Kitchen letrozole (FEMARA) 2.5 MG tablet   Oral   Take 2.5 mg by mouth  daily.         . levothyroxine (SYNTHROID, LEVOTHROID) 200 MCG tablet   Oral   Take 200 mcg by mouth daily before breakfast.         . LORazepam (ATIVAN) 0.5 MG tablet   Oral   Take 0.5 mg by mouth every 8 (eight) hours as needed for anxiety.          Marland Kitchen losartan (COZAAR) 25 MG tablet   Oral   Take 25 mg by mouth daily.         Marland Kitchen nystatin cream (MYCOSTATIN)   Topical   Apply 1 application topically 2 (two) times daily.         . potassium chloride SA (K-DUR,KLOR-CON) 20 MEQ tablet   Oral   Take 20 mEq by mouth 2 (two) times daily.           Allergies Penicillins  Family History  Problem Relation Age of Onset  . Hypertension Son   . CVA Mother   . Heart disease Father     Social History Social History  Substance Use Topics  . Smoking status: Former Smoker    Types: Cigarettes    Quit date: 10/21/1974  . Smokeless tobacco: None  . Alcohol Use: No    Review of Systems Unable to obtain secondary to the patient's lack of  responsiveness  ____________________________________________   PHYSICAL EXAM:  VITAL SIGNS: ED Triage Vitals  Enc Vitals Group     BP 11/08/14 0842 175/98 mmHg     Pulse Rate 11/08/14 0842 96     Resp 11/08/14 0842 18     Temp 11/08/14 0842 98.7 F (37.1 C)     Temp Source 11/08/14 0842 Axillary     SpO2 11/08/14 0842 96 %     Weight 11/08/14 0842 221 lb 5.5 oz (100.401 kg)     Height --      Head Cir --      Peak Flow --      Pain Score --      Pain Loc --      Pain Edu? --      Excl. in [place]? --     Constitutional: Elderly obese female, grimaces to pain but otherwise not responsive Eyes: Mild purulence in the corners of both of her eyes, Conjunctivae are normal. PERRL.  Head: Atraumatic. Nose: No epistaxis Mouth/Throat: Mucous membranes are dry.  Neck: No stridor.   Cardiovascular: Normal rate, regular rhythm. Grossly normal heart sounds.  Good peripheral circulation.  Exam is limited by habitus Respiratory: Normal respiratory effort.  No retractions. Lungs CTAB. Gastrointestinal: Obese, Soft and nontender. No distention. No abdominal bruits. No CVA tenderness. Musculoskeletal: No lower extremity tenderness nor edema.  No joint effusions. Neurologic:  Patient is not responding well enough to obtain a good neurological exam.  There is no facial droop.  She is not moving her extremities voluntarily at this time. Psychiatric: Grimaces to pain, otherwise not responding  ____________________________________________   LABS (all labs ordered are listed, but only abnormal results are displayed)  Labs Reviewed  COMPREHENSIVE METABOLIC PANEL - Abnormal; Notable for the following:    Glucose, Bld 104 (*)    GFR calc non Af Amer 54 (*)    All other components within normal limits  LIPASE, BLOOD - Abnormal; Notable for the following:    Lipase 14 (*)    All other components within normal limits  CBC WITH DIFFERENTIAL/PLATELET - Abnormal; Notable for the following:  WBC 15.5  (*)    MCHC 31.9 (*)    RDW 15.7 (*)    Neutro Abs 10.6 (*)    All other components within normal limits  URINALYSIS COMPLETEWITH MICROSCOPIC ([facility] ONLY) - Abnormal; Notable for the following:    Color, Urine AMBER (*)    APPearance CLOUDY (*)    Ketones, ur TRACE (*)    Hgb urine dipstick 3+ (*)    Protein, ur 30 (*)    Leukocytes, UA 2+ (*)    Bacteria, UA RARE (*)    Squamous Epithelial / LPF 0-5 (*)    All other components within normal limits  CULTURE, BLOOD (ROUTINE X 2)  CULTURE, BLOOD (ROUTINE X 2)  URINE CULTURE  LACTIC ACID, PLASMA  TROPONIN I  APTT  PROTIME-INR  LACTIC ACID, PLASMA   ____________________________________________  EKG  ED ECG REPORT I, Bluma Buresh, the attending physician, personally viewed and interpreted this ECG.  Date: 11/08/2014 EKG Time: 8:46 Rate: 95 Rhythm: normal sinus rhythm QRS Axis: normal Intervals: normal ST/T Wave abnormalities: Non-specific ST segment / T-wave changes, but no evidence of acute ischemia. Conduction Disutrbances: none Narrative Interpretation: unremarkable  ____________________________________________  RADIOLOGY  Ct Head Wo Contrast  11/08/2014   CLINICAL DATA:  Head injury and loss of consciousness after fall.  EXAM: CT HEAD WITHOUT CONTRAST  CT CERVICAL SPINE WITHOUT CONTRAST  TECHNIQUE: Multidetector CT imaging of the head and cervical spine was performed following the standard protocol without intravenous contrast. Multiplanar CT image reconstructions of the cervical spine were also generated.  COMPARISON:  None.  FINDINGS: CT HEAD FINDINGS  Bony calvarium appears intact. Sulcal effacement and large area of low density is seen involving the right posterior parietal cortex concerning for acute infarction. No midline shift is noted. Ventricular size is within normal limits. No hemorrhage is noted.  CT CERVICAL SPINE FINDINGS  Reversal of normal lordosis of cervical spine is noted, most likely due to severe  degenerative disc disease present at C4-5, C5-6 and C6-7. Moderate degenerative disc disease is also present at C3-4. No fracture or spondylolisthesis is noted. Mild bilateral posterior facet joint hypertrophy is noted in the upper cervical spine. Visualized lung apices appear normal.  IMPRESSION: Probable large area of acute infarction involving the right posterior parietal cortex. MRI may be performed for further evaluation.  Severe multilevel degenerative disc disease is noted in the cervical spine. No fracture or spondylolisthesis is noted.   Electronically Signed   By: Marijo Conception, M.D.   On: 11/08/2014 10:32   Ct Cervical Spine Wo Contrast  11/08/2014   CLINICAL DATA:  Head injury and loss of consciousness after fall.  EXAM: CT HEAD WITHOUT CONTRAST  CT CERVICAL SPINE WITHOUT CONTRAST  TECHNIQUE: Multidetector CT imaging of the head and cervical spine was performed following the standard protocol without intravenous contrast. Multiplanar CT image reconstructions of the cervical spine were also generated.  COMPARISON:  None.  FINDINGS: CT HEAD FINDINGS  Bony calvarium appears intact. Sulcal effacement and large area of low density is seen involving the right posterior parietal cortex concerning for acute infarction. No midline shift is noted. Ventricular size is within normal limits. No hemorrhage is noted.  CT CERVICAL SPINE FINDINGS  Reversal of normal lordosis of cervical spine is noted, most likely due to severe degenerative disc disease present at C4-5, C5-6 and C6-7. Moderate degenerative disc disease is also present at C3-4. No fracture or spondylolisthesis is noted. Mild bilateral posterior facet joint hypertrophy is  noted in the upper cervical spine. Visualized lung apices appear normal.  IMPRESSION: Probable large area of acute infarction involving the right posterior parietal cortex. MRI may be performed for further evaluation.  Severe multilevel degenerative disc disease is noted in the  cervical spine. No fracture or spondylolisthesis is noted.   Electronically Signed   By: Marijo Conception, M.D.   On: 11/08/2014 10:32   Ct Abdomen Pelvis W Contrast  11/08/2014   CLINICAL DATA:  81 year old female found unresponsive. Sepsis. Recent urinary tract infection. Initial encounter.  EXAM: CT ABDOMEN AND PELVIS WITH CONTRAST  TECHNIQUE: Multidetector CT imaging of the abdomen and pelvis was performed using the standard protocol following bolus administration of intravenous contrast.  CONTRAST:  163mL OMNIPAQUE IOHEXOL 350 MG/ML SOLN  COMPARISON:  Lumbar MRI 02/01/2013.  FINDINGS: Cardiomegaly. No pericardial effusion. Bilateral lung base scarring or atelectasis. No pleural effusion or lung base consolidation.  Advanced degenerative changes in the spine. Osteopenia. No acute osseous abnormality identified.  No pelvic free fluid. Negative uterus and adnexa. Negative rectum aside from mild retained stool.  The bladder is mildly distended and there is perivesical stranding (series 2, image 76). There is a 2-3 cm diverticulum at the bladder dome (series 2, image 69 and sagittal image 87). Negative distal left ureters. No gas within the bladder.  Large body habitus and laxity of the ventral abdominal wall. Redundant sigmoid colon. Proximal sigmoid diverticulosis but no active inflammation. Negative left colon. Negative transverse colon. Redundant right colon. The entire right lateral abdomen wall is not included due to large body habitus. No large bowel inflammation identified. Negative terminal ileum. Appendix not identified. No dilated small bowel. No ventral abdominal hernia identified. Postoperative changes to the right ventral abdominal wall. Negative stomach.  Liver, spleen, pancreas (atrophied), and adrenal glands are within normal limits. The gallbladder appears to be surgically absent. The portal venous system is patent. Extensive Aortoiliac calcified atherosclerosis noted. Major arterial structures  are patent. No abdominal free fluid. No lymphadenopathy. Nonobstructing right midpole 11 mm calculus. Otherwise normal for age kidneys. Renal contrast enhancement and excretion is symmetric.  There is a 4.5 cm round and spiculated intermediate density (40 Hounsfield units) right breast mass. See coronal image 83.  IMPRESSION: 1. Bladder wall inflammation compatible with urinary infection. 2-3 cm bladder fundus diverticulum. Kidneys are negative aside from nonobstructing right nephrolithiasis. 2. 4.5 cm right breast mass. Recommend mammographic correlation once the patient recovers from the acute illness. 3. No other acute or inflammatory process in the abdomen or pelvis. 4. Extensive calcified aortic atherosclerosis. Extensive spine degeneration.   Electronically Signed   By: Genevie Ann M.D.   On: 11/08/2014 11:52   Dg Chest Port 1 View  11/08/2014   CLINICAL DATA:  Sepsis.  EXAM: PORTABLE CHEST - 1 VIEW  COMPARISON:  November 02, 2014.  FINDINGS: Stable cardiomegaly. No pneumothorax or significant pleural effusion is noted. Right lung is clear. Mild left basilar opacity is noted concerning for subsegmental atelectasis or scarring. Severe degenerative changes seen involving the glenohumeral joints bilaterally.  IMPRESSION: Mild left basilar subsegmental atelectasis or scarring. Stable cardiomegaly.   Electronically Signed   By: Marijo Conception, M.D.   On: 11/08/2014 09:58    ____________________________________________   PROCEDURES  Procedure(s) performed: None  Critical Care performed: Yes, see critical care note(s)   NIH Stroke Scale  Interval: Baseline Time: 12:46 PM Person Administering Scale: Rosalio Catterton  Administer stroke scale items in the order listed. Record performance in  each category after each subscale exam. Do not go back and change scores. Follow directions provided for each exam technique. Scores should reflect what the patient does, not what the clinician thinks the patient can do.  The clinician should record answers while administering the exam and work quickly. Except where indicated, the patient should not be coached (i.e., repeated requests to patient to make a special effort).   1a  Level of consciousness: 2=not alert, requires repeated stimulation to attend, or is obtunded and requires strong or painful stimulation to make movements (not stereotyped)  1b. LOC questions:  2=Performs neither task correctly  1c. LOC commands: 2=Performs neither task correctly  2.  Best Gaze: 0=normal  3.  Visual: 0=No visual loss  4. Facial Palsy: 0=Normal symmetric movement  5a.  Motor left arm: 4=No movement  5b.  Motor right arm: 4=No movement  6a. motor left leg: 4=No movement  6b  Motor right leg:  4=No movement  7. Limb Ataxia: 0=Absent  8.  Sensory: 1=Mild to moderate sensory loss; patient feels pinprick is less sharp or is dull on the affected side; there is a loss of superficial pain with pinprick but patient is aware She is being touched  9. Best Language:  3=Mute, global aphasia; no usable speech or auditory comprehension  10. Dysarthria: 2=Severe; patient speech is so slurred as to be unintelligible in the absence of or our of proportion to any dysphagia, or is mute/anarthric  11. Extinction and Inattention: 2=Profound hemi-inattention or hemi-inattention to more than one modality. Does not recognize own hand or orients only to one side of space  12. Distal motor function: 2=No voluntary extension after 5 seconds. Movement of the fingers at another time are not scored   Total:   32     CRITICAL CARE Performed by: Hinda Kehr  Total critical care time: 45 minutes  Critical care time was exclusive of separately billable procedures and treating other patients.  Critical care was necessary to treat or prevent imminent or life-threatening deterioration.  Critical care was time spent personally by me on the following activities: development of treatment plan with  patient and/or surrogate as well as nursing, discussions with consultants, evaluation of patient's response to treatment, examination of patient, obtaining history from patient or surrogate, ordering and performing treatments and interventions, ordering and review of laboratory studies, ordering and review of radiographic studies, pulse oximetry and re-evaluation of patient's condition.  ____________________________________________   INITIAL IMPRESSION / ASSESSMENT AND PLAN / ED COURSE  Pertinent labs & imaging results that were available during my care of the patient were reviewed by me and considered in my medical decision making (see chart for details).  The patient is unresponsive and in the setting of a recent admission for sepsis secondary to urinary tract infection, I must assume sepsis at this time.  She does not appear toxic though she is not responding.  I do not believe she needs intubation at this time as she is protecting her airway and has normal vital signs.  We will watch closely for decompensation while pursuing a sepsis workup.  I will initiate empiric antibiotics treatment after urine and blood is obtained.  After we have treated for sepsis, I will also obtain a head CT given her unresponsiveness to rule out an acute intracranial event.  ----------------------------------------- 11:16 AM on 11/08/2014 -----------------------------------------  The patient's noncontrast head CT is concerning for acute infarction.  She has no evidence of acute injury to her cervical spine.  She remains unresponsive.  She has a persistent severe urinary tract infection even after which should have been adequate treatment.  Given that her UTI should have resolved by now, I will evaluate her abdomen/pelvis with IV contrast CT scan to look for pyelonephritis and/or an obstructing stone.  I have been in twice to check on the patient and to update the patient's family about her very concerning findings.   Though she does have an acute or subacute infarction, her son states that she has been "not right" since Tuesday (about 5 days ago), and that she she seen normal last night but was unresponsive this morning.  She is outside the window for TPA and would not be a good candidate regardless.  ED Sepsis - Repeat Assessment   Performed at:    11/08/2014 at 11:30 AM  Last Vitals:    Blood pressure 161/80, pulse 90, temperature 97.8 F (36.6 C), temperature source Rectal, resp. rate 18, weight 221 lb 5.5 oz (100.401 kg), SpO2 100 %.  Heart:      Normal rate and rhythm, no r/m/g  Lungs:     Normal respiratory effort and rate, clear to auscultation bilaterally  Capillary Refill:   Good peripheral circulation and capillary refill  Peripheral Pulse (include location): Radial pulse is intact bilaterally   Skin (include color):   Pale but unchanged  ----------------------------------------- 12:45 PM on 11/08/2014 -----------------------------------------  (Note that documentation was delayed due to multiple ED patients requiring immediate care.)  The patient remains essentially unresponsive, flinching to painful stimuli but otherwise not responding.  She has what appears to be in acute infarction as well as a urinary tract infection and sepsis.  I discussed with the family on multiple occasions that these are grave results and that she is critically ill.  I have spoken with the hospitalist who will admit.    ____________________________________________  FINAL CLINICAL IMPRESSION(S) / ED DIAGNOSES  Final diagnoses:  Acute CVA (cerebrovascular accident)  Sepsis, due to unspecified organism  Complicated UTI (urinary tract infection)  Unresponsive state      NEW MEDICATIONS STARTED DURING THIS VISIT:  New Prescriptions   No medications on file     Hinda Kehr, MD 11/08/14 1247

## 2014-11-08 NOTE — ED Notes (Signed)
MD at bedside. 

## 2014-11-09 DIAGNOSIS — I639 Cerebral infarction, unspecified: Principal | ICD-10-CM

## 2014-11-09 DIAGNOSIS — A419 Sepsis, unspecified organism: Secondary | ICD-10-CM

## 2014-11-09 DIAGNOSIS — C50919 Malignant neoplasm of unspecified site of unspecified female breast: Secondary | ICD-10-CM

## 2014-11-09 DIAGNOSIS — R404 Transient alteration of awareness: Secondary | ICD-10-CM

## 2014-11-09 DIAGNOSIS — N390 Urinary tract infection, site not specified: Secondary | ICD-10-CM

## 2014-11-09 DIAGNOSIS — C719 Malignant neoplasm of brain, unspecified: Secondary | ICD-10-CM

## 2014-11-09 LAB — CULTURE, BLOOD (ROUTINE X 2)
CULTURE: NO GROWTH
Culture: NO GROWTH

## 2014-11-09 LAB — LIPID PANEL
CHOL/HDL RATIO: 3.6 ratio
CHOLESTEROL: 119 mg/dL (ref 0–200)
HDL: 33 mg/dL — ABNORMAL LOW (ref >40)
LDL Cholesterol: 62 mg/dL (ref 0–99)
TRIGLYCERIDES: 119 mg/dL (ref <150)
VLDL: 24 mg/dL (ref 0–40)

## 2014-11-09 LAB — URINE CULTURE: SPECIAL REQUESTS: NORMAL

## 2014-11-09 LAB — HEMOGLOBIN A1C: HEMOGLOBIN A1C: 5.3 % (ref 4.0–6.0)

## 2014-11-09 MED ORDER — DOCUSATE SODIUM 100 MG PO CAPS: 100.0000 mg | ORAL_CAPSULE | Freq: Two times a day (BID) | ORAL | Status: DC | PRN

## 2014-11-09 MED ORDER — LEVETIRACETAM 750 MG PO TABS
750.0000 mg | ORAL_TABLET | Freq: Two times a day (BID) | ORAL | Status: DC
  Administered 2014-11-09 – 2014-11-11 (×5): 750 mg via ORAL
  Filled 2014-11-09 (×5): qty 1

## 2014-11-09 MED ORDER — ALLOPURINOL 100 MG PO TABS
300.0000 mg | ORAL_TABLET | Freq: Every day | ORAL | Status: DC
  Administered 2014-11-09 – 2014-11-11 (×3): 300 mg via ORAL
  Filled 2014-11-09 (×3): qty 3

## 2014-11-09 MED ORDER — VANCOMYCIN HCL IN DEXTROSE 1-5 GM/200ML-% IV SOLN: 1000.0000 mg | INTRAVENOUS | Status: DC

## 2014-11-09 MED ORDER — LEVOTHYROXINE SODIUM 100 MCG PO TABS
200.0000 ug | ORAL_TABLET | Freq: Every day | ORAL | Status: DC
  Administered 2014-11-10 – 2014-11-11 (×2): 200 ug via ORAL
  Filled 2014-11-09 (×2): qty 2

## 2014-11-09 MED ORDER — CETYLPYRIDINIUM CHLORIDE 0.05 % MT LIQD
7.0000 mL | Freq: Two times a day (BID) | OROMUCOSAL | Status: DC
  Administered 2014-11-09 – 2014-11-11 (×5): 7 mL via OROMUCOSAL

## 2014-11-09 MED ORDER — PANTOPRAZOLE SODIUM 40 MG PO TBEC
40.0000 mg | DELAYED_RELEASE_TABLET | Freq: Every day | ORAL | Status: DC
  Administered 2014-11-09 – 2014-11-10 (×2): 40 mg via ORAL
  Filled 2014-11-09 (×2): qty 1

## 2014-11-09 MED ORDER — DEXAMETHASONE SODIUM PHOSPHATE 10 MG/ML IJ SOLN
10.0000 mg | Freq: Two times a day (BID) | INTRAMUSCULAR | Status: DC
  Administered 2014-11-09 – 2014-11-11 (×5): 10 mg via INTRAVENOUS
  Filled 2014-11-09 (×5): qty 1

## 2014-11-09 MED ORDER — VANCOMYCIN HCL 10 G IV SOLR
1250.0000 mg | INTRAVENOUS | Status: DC
  Administered 2014-11-09 – 2014-11-11 (×3): 1250 mg via INTRAVENOUS
  Filled 2014-11-09 (×4): qty 1250

## 2014-11-09 MED ORDER — ADULT MULTIVITAMIN W/MINERALS CH
1.0000 | ORAL_TABLET | Freq: Every day | ORAL | Status: DC
  Administered 2014-11-10 – 2014-11-11 (×2): 1 via ORAL
  Filled 2014-11-09 (×2): qty 1

## 2014-11-09 MED ORDER — ATORVASTATIN CALCIUM 10 MG PO TABS
10.0000 mg | ORAL_TABLET | Freq: Every day | ORAL | Status: DC
  Administered 2014-11-10: 17:00:00 10 mg via ORAL
  Filled 2014-11-09 (×2): qty 1

## 2014-11-09 MED ORDER — CEFTRIAXONE SODIUM 1 G IJ SOLR
1.0000 g | INTRAMUSCULAR | Status: DC
Start: 2014-11-09 — End: 2014-11-11
  Administered 2014-11-09 – 2014-11-10 (×2): 1 g via INTRAVENOUS
  Filled 2014-11-09 (×3): qty 10

## 2014-11-09 MED ORDER — LOSARTAN POTASSIUM 50 MG PO TABS
25.0000 mg | ORAL_TABLET | Freq: Every day | ORAL | Status: DC
  Administered 2014-11-10 – 2014-11-11 (×2): 25 mg via ORAL
  Filled 2014-11-09 (×2): qty 1

## 2014-11-09 MED ORDER — VITAMIN D 1000 UNITS PO TABS
2000.0000 [IU] | ORAL_TABLET | Freq: Every day | ORAL | Status: DC
  Administered 2014-11-10 – 2014-11-11 (×2): 2000 [IU] via ORAL
  Filled 2014-11-09 (×2): qty 2

## 2014-11-09 MED ORDER — VANCOMYCIN HCL 10 G IV SOLR
1250.0000 mg | Freq: Once | INTRAVENOUS | Status: AC
  Administered 2014-11-09: 10:00:00 1250 mg via INTRAVENOUS
  Filled 2014-11-09: qty 1250

## 2014-11-09 MED ORDER — FLUOXETINE HCL 20 MG PO CAPS
40.0000 mg | ORAL_CAPSULE | Freq: Every day | ORAL | Status: DC
  Administered 2014-11-09 – 2014-11-11 (×3): 40 mg via ORAL
  Filled 2014-11-09 (×3): qty 2

## 2014-11-09 NOTE — Care Management Important Message (Signed)
Important Message  Patient Details  Name: Allison Gonzalez MRN: [id number] Date of Birth: 1929-12-18   Medicare Important Message Given:  Yes-second notification given    Shelbie Ammons, RN 11/09/2014, 11:38 AM

## 2014-11-09 NOTE — Care Management (Signed)
Admitted to [name] with the diagnosis of CVA. Discharged from this facility 11/05/14. Son, Shanon Brow, lives with her [phone]). Declined home health when discharged from this facility. Sees Dr. Ola Spurr. Uses a rolling walker and wheelchair in the home.  WBC's 15.4  MRI Brain : temporary lobe mass. Consults for speech, physical therapy, vascular, oncology, neurology, and palliative care. Shelbie Ammons RN MSN Care Management [phone]

## 2014-11-09 NOTE — Discharge Summary (Signed)
[facility] [place] NAME: Allison Gonzalez    MR#:  [id number]  DATE OF BIRTH:  29-Dec-1929  DATE OF ADMISSION:  11/02/2014 ADMITTING PHYSICIAN: Henreitta Leber, MD  DATE OF DISCHARGE: 11/05/2014  1:10 PM  PRIMARY CARE PHYSICIAN: Adrian Prows, MD    ADMISSION DIAGNOSIS:  Sepsis secondary to UTI [A41.9, N39.0]  DISCHARGE DIAGNOSIS:  Active Problems:   Sepsis   SECONDARY DIAGNOSIS:   Past Medical History  Diagnosis Date  . Breast cancer 2015    positive w/ radiation  . Hypertension   . Depression   . Anxiety   . Sleep apnea   . Hypothyroidism   . Gout of right foot 10/21/14  . Gout of right foot   . Hypercholesterolemia 10/21/14  . Diabetes mellitus without complication      ADMITTING HISTORY  Allison Gonzalez is a 81 y.o. female with a known history of breast cancer, hypertension, depression, anxiety, hypothyroidism, hyperlipidemia, obstructive sleep apnea who presented to the hospital due to altered mental status and weakness. Patient apparently had developed a rash on her buttocks this past week and went to the urgent care at [facility] and was diagnosed with a fungal rash and started on nystatin. She then today when she was out with her son became a bit confused and was lethargic. She was then brought to the ER for further evaluation. Patient was noted to have a leukocytosis of 16,000 and also noted to have urinary tract infection. Hospitalist services were contacted for further treatment and evaluation. Patient does complain of occasional dysuria, frequency.   HOSPITAL COURSE:   * Proteus UTI with acute encephalopathy Improved. Patient alert, awake and oriented at time of discharge. She does have flat affect and needs f/u with Psychiatry which i discussed with husband at bedside. She does get ECT treatments as OP per husband.  * Hypovolemic hyponatremia Improved with IV fluids.  # history of breast  cancer-continue Femara  # hypothyroidism-continue Synthroid.  # hypertension-continue losartan.  # depression/anxiety-continue Wellbutrin.  # gout-continue allopurinol.  Stable for d/c home on PO abx.  CONSULTS OBTAINED:     DRUG ALLERGIES:   Allergies  Allergen Reactions  . Penicillins Rash    DISCHARGE MEDICATIONS:   Discharge Medication List as of 11/05/2014 10:52 AM    START taking these medications   Details  ciprofloxacin (CIPRO) 500 MG tablet Take 1 tablet (500 mg total) by mouth 2 (two) times daily., Starting 11/05/2014, Until Discontinued, Normal      CONTINUE these medications which have NOT CHANGED   Details  allopurinol (ZYLOPRIM) 300 MG tablet Take 300 mg by mouth daily., Until Discontinued, Historical Med    aspirin EC 81 MG tablet Take 81 mg by mouth daily., Until Discontinued, Historical Med    atorvastatin (LIPITOR) 10 MG tablet Take 10 mg by mouth daily., Until Discontinued, Historical Med    buPROPion (WELLBUTRIN SR) 150 MG 12 hr tablet Take 150 mg by mouth daily., Until Discontinued, Historical Med    docusate sodium (COLACE) 100 MG capsule Take 100 mg by mouth 2 (two) times daily as needed for mild constipation. , Until Discontinued, Historical Med    FLUoxetine (PROZAC) 40 MG capsule Take 40 mg by mouth daily., Until Discontinued, Historical Med    letrozole (FEMARA) 2.5 MG tablet Take 2.5 mg by mouth daily., Until Discontinued, Historical Med    levothyroxine (SYNTHROID, LEVOTHROID) 200 MCG tablet Take 200 mcg by mouth daily before  breakfast., Until Discontinued, Historical Med    LORazepam (ATIVAN) 0.5 MG tablet Take 0.5 mg by mouth every 8 (eight) hours as needed for anxiety. , Until Discontinued, Historical Med    losartan (COZAAR) 25 MG tablet Take 25 mg by mouth daily., Until Discontinued, Historical Med    nystatin cream (MYCOSTATIN) Apply 1 application topically 2 (two) times daily., Until Discontinued, Historical Med    potassium  chloride SA (K-DUR,KLOR-CON) 20 MEQ tablet Take 20 mEq by mouth 2 (two) times daily., Until Discontinued, Historical Med    esomeprazole (NEXIUM) 40 MG capsule Take 40 mg by mouth daily at 12 noon., Until Discontinued, Historical Med         Today    VITAL SIGNS:  Blood pressure 133/72, pulse 88, temperature 98.7 F (37.1 C), temperature source Oral, resp. rate 20, height 5\' 8"  (1.727 m), weight 98.022 kg (216 lb 1.6 oz), SpO2 98 %.  I/O:  No intake or output data in the 24 hours ending 11/09/14 1409  PHYSICAL EXAMINATION:  Physical Exam  GENERAL:  81 y.o.-year-old patient lying in the bed with no acute distress.  LUNGS: Normal breath sounds bilaterally, no wheezing, rales,rhonchi or crepitation. No use of accessory muscles of respiration.  CARDIOVASCULAR: S1, S2 normal. No murmurs, rubs, or gallops.  ABDOMEN: Soft, non-tender, non-distended. Bowel sounds present. No organomegaly or mass.  NEUROLOGIC: Moves all 4 extremities. PSYCHIATRIC: The patient is alert and oriented x 3.  SKIN: No obvious rash, lesion, or ulcer.   DATA REVIEW:   CBC  Recent Labs Lab 11/08/14 0854  WBC 15.5*  HGB 14.5  HCT 45.5  PLT 269    Chemistries   Recent Labs Lab 11/08/14 0854  NA 139  K 4.6  CL 102  CO2 26  GLUCOSE 104*  BUN 19  CREATININE 0.95  CALCIUM 10.0  AST 38  ALT 23  ALKPHOS 122  BILITOT 0.4    Cardiac Enzymes  Recent Labs Lab 11/08/14 0854  TROPONINI <0.03    Microbiology Results  Results for orders placed or performed during the hospital encounter of 11/02/14  Blood culture (routine x 2)     Status: None   Collection Time: 11/02/14  6:21 PM  Result Value Ref Range Status   Specimen Description BLOOD RIGHT HAND  Final   Special Requests BAA,5ML,ANA,AER  Final   Culture NO GROWTH 7 DAYS  Final   Report Status 11/09/2014 FINAL  Final  Blood culture (routine x 2)     Status: None   Collection Time: 11/02/14  6:21 PM  Result Value Ref Range Status    Specimen Description BLOOD LEFT ARM  Final   Special Requests BAA,10ML,ANA,AER  Final   Culture NO GROWTH 7 DAYS  Final   Report Status 11/09/2014 FINAL  Final  Urine culture     Status: None   Collection Time: 11/02/14  6:21 PM  Result Value Ref Range Status   Specimen Description URINE, RANDOM  Final   Special Requests Normal  Final   Culture   Final    >=100,000 COLONIES/mL PROTEUS MIRABILIS >=100,000 COLONIES/mL ESCHERICHIA COLI    Report Status 11/06/2014 FINAL  Final   Organism ID, Bacteria PROTEUS MIRABILIS  Final   Organism ID, Bacteria ESCHERICHIA COLI  Final      Susceptibility   Escherichia coli - MIC*    AMPICILLIN 4 SENSITIVE Sensitive     CEFTAZIDIME <=1 SENSITIVE Sensitive     CEFAZOLIN <=4 SENSITIVE Sensitive  CEFTRIAXONE <=1 SENSITIVE Sensitive     CIPROFLOXACIN <=0.25 SENSITIVE Sensitive     GENTAMICIN <=1 SENSITIVE Sensitive     IMIPENEM <=0.25 SENSITIVE Sensitive     TRIMETH/SULFA <=20 SENSITIVE Sensitive     NITROFURANTOIN Value in next row Sensitive      SENSITIVE<=16    PIP/TAZO Value in next row Sensitive      SENSITIVE<=4    LEVOFLOXACIN Value in next row Sensitive      SENSITIVE<=0.12    * >=100,000 COLONIES/mL ESCHERICHIA COLI   Proteus mirabilis - MIC*    AMPICILLIN Value in next row Sensitive      SENSITIVE<=0.12    CEFTAZIDIME Value in next row Sensitive      SENSITIVE<=0.12    CEFAZOLIN Value in next row Sensitive      SENSITIVE<=0.12    CEFTRIAXONE Value in next row Sensitive      SENSITIVE<=0.12    CIPROFLOXACIN Value in next row Sensitive      SENSITIVE<=0.12    GENTAMICIN Value in next row Sensitive      SENSITIVE<=0.12    IMIPENEM Value in next row Sensitive      SENSITIVE<=0.12    TRIMETH/SULFA Value in next row Sensitive      SENSITIVE<=0.12    NITROFURANTOIN Value in next row Resistant      RESISTANT128    PIP/TAZO Value in next row Sensitive      SENSITIVE<=4    AMPICILLIN/SULBACTAM Value in next row Sensitive       SENSITIVE<=2    * >=100,000 COLONIES/mL PROTEUS MIRABILIS    RADIOLOGY:  Ct Head Wo Contrast  11/08/2014   CLINICAL DATA:  Head injury and loss of consciousness after fall.  EXAM: CT HEAD WITHOUT CONTRAST  CT CERVICAL SPINE WITHOUT CONTRAST  TECHNIQUE: Multidetector CT imaging of the head and cervical spine was performed following the standard protocol without intravenous contrast. Multiplanar CT image reconstructions of the cervical spine were also generated.  COMPARISON:  None.  FINDINGS: CT HEAD FINDINGS  Bony calvarium appears intact. Sulcal effacement and large area of low density is seen involving the right posterior parietal cortex concerning for acute infarction. No midline shift is noted. Ventricular size is within normal limits. No hemorrhage is noted.  CT CERVICAL SPINE FINDINGS  Reversal of normal lordosis of cervical spine is noted, most likely due to severe degenerative disc disease present at C4-5, C5-6 and C6-7. Moderate degenerative disc disease is also present at C3-4. No fracture or spondylolisthesis is noted. Mild bilateral posterior facet joint hypertrophy is noted in the upper cervical spine. Visualized lung apices appear normal.  IMPRESSION: Probable large area of acute infarction involving the right posterior parietal cortex. MRI may be performed for further evaluation.  Severe multilevel degenerative disc disease is noted in the cervical spine. No fracture or spondylolisthesis is noted.   Electronically Signed   By: Marijo Conception, M.D.   On: 11/08/2014 10:32   Ct Cervical Spine Wo Contrast  11/08/2014   CLINICAL DATA:  Head injury and loss of consciousness after fall.  EXAM: CT HEAD WITHOUT CONTRAST  CT CERVICAL SPINE WITHOUT CONTRAST  TECHNIQUE: Multidetector CT imaging of the head and cervical spine was performed following the standard protocol without intravenous contrast. Multiplanar CT image reconstructions of the cervical spine were also generated.  COMPARISON:  None.   FINDINGS: CT HEAD FINDINGS  Bony calvarium appears intact. Sulcal effacement and large area of low density is seen involving the right posterior parietal cortex  concerning for acute infarction. No midline shift is noted. Ventricular size is within normal limits. No hemorrhage is noted.  CT CERVICAL SPINE FINDINGS  Reversal of normal lordosis of cervical spine is noted, most likely due to severe degenerative disc disease present at C4-5, C5-6 and C6-7. Moderate degenerative disc disease is also present at C3-4. No fracture or spondylolisthesis is noted. Mild bilateral posterior facet joint hypertrophy is noted in the upper cervical spine. Visualized lung apices appear normal.  IMPRESSION: Probable large area of acute infarction involving the right posterior parietal cortex. MRI may be performed for further evaluation.  Severe multilevel degenerative disc disease is noted in the cervical spine. No fracture or spondylolisthesis is noted.   Electronically Signed   By: Marijo Conception, M.D.   On: 11/08/2014 10:32   Mr Brain Wo Contrast  11/08/2014   CLINICAL DATA:  Found unresponsive 3 days ago. Concern for acute stroke. History of right breast cancer.  EXAM: MRI HEAD WITHOUT CONTRAST  MRA HEAD WITHOUT CONTRAST  TECHNIQUE: Multiplanar, multiecho pulse sequences of the brain and surrounding structures were obtained without intravenous contrast. Angiographic images of the head were obtained using MRA technique without contrast.  COMPARISON:  Head CT 11/08/2014  FINDINGS: MRI HEAD FINDINGS  There is cortically based, mildly restricted diffusion involving the posterior right insula and anterior right parietal lobe without definite corresponding T2 signal abnormality. There is a heterogeneously T2 hyperintense right posterior temporal intra-axial mass measuring 4.7 x 3.8 cm with heterogeneous areas of internal restricted diffusion. There is moderate surrounding vasogenic edema in the temporal, occipital, and parietal lobes  with mass effect on the temporal and occipital horns of the right lateral ventricle. There is no midline shift.  There is mild-to-moderate cerebral atrophy, particularly in the left perisylvian region. No intracranial hemorrhage or extra-axial fluid collection is seen.  Prior bilateral cataract extraction is noted. Minimal posterior left ethmoid air cell mucosal thickening is present. Mastoid air cells are clear. Major intracranial vascular flow voids are preserved.  MRA HEAD FINDINGS  The study is mildly motion degraded.  Visualized distal vertebral arteries are patent with the left being slightly dominant. Evaluation of the proximal to mid V4 segments is partly limited due to technical factors with decreased signal in this region. There is suggestion of a moderate to severe proximal left V4 stenosis. PICA origins are not clearly identified. Right AICA and bilateral SCA origins are patent. Basilar artery is patent without stenosis. PCA origins are patent. There is no significant proximal PCA stenosis, although there may be mild to moderate mid right P2 stenosis. The left P1 segment is patent without significant stenosis, however there is an apparent severe stenosis/ short segment occlusion of the proximal left P2 segment with reconstitution distally. Left PCA branch vessels distal the this are attenuated.  The intracranial right ICA is patent with mild supraclinoid stenosis. Intracranial left ICA appears mildly small in caliber diffusely compared to the right which may be developmental as the left A1 segment appears to be either absent or severely hypoplastic. There is diminished flow related enhancement in the distal left carotid siphon which progressively decreases with only a very small amount of signal at the carotid terminus and into the left MCA, suggestive of a severe supraclinoid ICA stenosis. The left M1 segment is grossly patent although poorly evaluated given the decreased signal. There are multiple  patent left M2 branches in the sylvian fissure. Right M1 and proximal M2 segments are patent without significant stenosis. There is  mild distal right MCA branch vessel irregularity. Right A1 segment is patent without stenosis. Anterior communicating artery is patent. Right ACA supplies the left A2.  IMPRESSION: 1. 4.7 cm posterior right temporal lobe mass with moderate surrounding edema. Considerations include metastasis from patient's known breast cancer or primary CNS neoplasm. Further evaluation with post-contrast brain MRI is recommended. 2. Mildly restricted diffusion involving posterior right insula and anterior right parietal cortex, which could reflect a small acute infarct or sequelae of recent seizure activity. 3. Mildly degraded head MRA. Decreased flow related enhancement in the distal left ICA and proximal left MCA concerning for high-grade, flow reducing proximal left supraclinoid ICA stenosis. 4. Suspected moderate to severe proximal left V4 vertebral artery stenosis. 5. Severe stenosis or short segment occlusion of the proximal left P2 segment.   Electronically Signed   By: Logan Bores   On: 11/08/2014 17:35   Ct Abdomen Pelvis W Contrast  11/08/2014   CLINICAL DATA:  81 year old female found unresponsive. Sepsis. Recent urinary tract infection. Initial encounter.  EXAM: CT ABDOMEN AND PELVIS WITH CONTRAST  TECHNIQUE: Multidetector CT imaging of the abdomen and pelvis was performed using the standard protocol following bolus administration of intravenous contrast.  CONTRAST:  157mL OMNIPAQUE IOHEXOL 350 MG/ML SOLN  COMPARISON:  Lumbar MRI 02/01/2013.  FINDINGS: Cardiomegaly. No pericardial effusion. Bilateral lung base scarring or atelectasis. No pleural effusion or lung base consolidation.  Advanced degenerative changes in the spine. Osteopenia. No acute osseous abnormality identified.  No pelvic free fluid. Negative uterus and adnexa. Negative rectum aside from mild retained stool.  The bladder  is mildly distended and there is perivesical stranding (series 2, image 76). There is a 2-3 cm diverticulum at the bladder dome (series 2, image 69 and sagittal image 87). Negative distal left ureters. No gas within the bladder.  Large body habitus and laxity of the ventral abdominal wall. Redundant sigmoid colon. Proximal sigmoid diverticulosis but no active inflammation. Negative left colon. Negative transverse colon. Redundant right colon. The entire right lateral abdomen wall is not included due to large body habitus. No large bowel inflammation identified. Negative terminal ileum. Appendix not identified. No dilated small bowel. No ventral abdominal hernia identified. Postoperative changes to the right ventral abdominal wall. Negative stomach.  Liver, spleen, pancreas (atrophied), and adrenal glands are within normal limits. The gallbladder appears to be surgically absent. The portal venous system is patent. Extensive Aortoiliac calcified atherosclerosis noted. Major arterial structures are patent. No abdominal free fluid. No lymphadenopathy. Nonobstructing right midpole 11 mm calculus. Otherwise normal for age kidneys. Renal contrast enhancement and excretion is symmetric.  There is a 4.5 cm round and spiculated intermediate density (40 Hounsfield units) right breast mass. See coronal image 83.  IMPRESSION: 1. Bladder wall inflammation compatible with urinary infection. 2-3 cm bladder fundus diverticulum. Kidneys are negative aside from nonobstructing right nephrolithiasis. 2. 4.5 cm right breast mass. Recommend mammographic correlation once the patient recovers from the acute illness. 3. No other acute or inflammatory process in the abdomen or pelvis. 4. Extensive calcified aortic atherosclerosis. Extensive spine degeneration.   Electronically Signed   By: Genevie Ann M.D.   On: 11/08/2014 11:52   US Carotid Bilateral  11/08/2014   CLINICAL DATA:  Unresponsive, right temporal mass  EXAM: BILATERAL CAROTID  DUPLEX ULTRASOUND  TECHNIQUE: Pearline Cables scale imaging, color Doppler and duplex ultrasound were performed of bilateral carotid and vertebral arteries in the neck. The examination is limited is a patient did not complete the left half  of the study.  COMPARISON:  None.  FINDINGS: Criteria: Quantification of carotid stenosis is based on velocity parameters that correlate the residual internal carotid diameter with NASCET-based stenosis levels, using the diameter of the distal internal carotid lumen as the denominator for stenosis measurement.  The following velocity measurements were obtained:  RIGHT  ICA:  176/44 cm/sec  CCA:  00/76 cm/sec  SYSTOLIC ICA/CCA RATIO:  3.2  DIASTOLIC ICA/CCA RATIO:  2.9  ECA:  248 cm/sec  LEFT  Not completed at the patient's wishes  RIGHT CAROTID ARTERY: Diffuse atherosclerotic plaque is noted in the region of the carotid bulb and proximal internal carotid artery. The waveforms, velocities and flow velocity ratios suggest a stenosis in the 50-69% range.  RIGHT VERTEBRAL ARTERY:  Antegrade in nature.  IMPRESSION: 50-69% stenosis in the right proximal internal carotid artery.  Patient refused further imaging and the left carotid artery was not interrogated.   Electronically Signed   By: Inez Catalina M.D.   On: 11/08/2014 18:41   Dg Chest Port 1 View  11/08/2014   CLINICAL DATA:  Sepsis.  EXAM: PORTABLE CHEST - 1 VIEW  COMPARISON:  November 02, 2014.  FINDINGS: Stable cardiomegaly. No pneumothorax or significant pleural effusion is noted. Right lung is clear. Mild left basilar opacity is noted concerning for subsegmental atelectasis or scarring. Severe degenerative changes seen involving the glenohumeral joints bilaterally.  IMPRESSION: Mild left basilar subsegmental atelectasis or scarring. Stable cardiomegaly.   Electronically Signed   By: Marijo Conception, M.D.   On: 11/08/2014 09:58   Mr Jodene Nam Head/brain Wo Cm  11/08/2014   CLINICAL DATA:  Found unresponsive 3 days ago. Concern for acute  stroke. History of right breast cancer.  EXAM: MRI HEAD WITHOUT CONTRAST  MRA HEAD WITHOUT CONTRAST  TECHNIQUE: Multiplanar, multiecho pulse sequences of the brain and surrounding structures were obtained without intravenous contrast. Angiographic images of the head were obtained using MRA technique without contrast.  COMPARISON:  Head CT 11/08/2014  FINDINGS: MRI HEAD FINDINGS  There is cortically based, mildly restricted diffusion involving the posterior right insula and anterior right parietal lobe without definite corresponding T2 signal abnormality. There is a heterogeneously T2 hyperintense right posterior temporal intra-axial mass measuring 4.7 x 3.8 cm with heterogeneous areas of internal restricted diffusion. There is moderate surrounding vasogenic edema in the temporal, occipital, and parietal lobes with mass effect on the temporal and occipital horns of the right lateral ventricle. There is no midline shift.  There is mild-to-moderate cerebral atrophy, particularly in the left perisylvian region. No intracranial hemorrhage or extra-axial fluid collection is seen.  Prior bilateral cataract extraction is noted. Minimal posterior left ethmoid air cell mucosal thickening is present. Mastoid air cells are clear. Major intracranial vascular flow voids are preserved.  MRA HEAD FINDINGS  The study is mildly motion degraded.  Visualized distal vertebral arteries are patent with the left being slightly dominant. Evaluation of the proximal to mid V4 segments is partly limited due to technical factors with decreased signal in this region. There is suggestion of a moderate to severe proximal left V4 stenosis. PICA origins are not clearly identified. Right AICA and bilateral SCA origins are patent. Basilar artery is patent without stenosis. PCA origins are patent. There is no significant proximal PCA stenosis, although there may be mild to moderate mid right P2 stenosis. The left P1 segment is patent without  significant stenosis, however there is an apparent severe stenosis/ short segment occlusion of the proximal left P2 segment  with reconstitution distally. Left PCA branch vessels distal the this are attenuated.  The intracranial right ICA is patent with mild supraclinoid stenosis. Intracranial left ICA appears mildly small in caliber diffusely compared to the right which may be developmental as the left A1 segment appears to be either absent or severely hypoplastic. There is diminished flow related enhancement in the distal left carotid siphon which progressively decreases with only a very small amount of signal at the carotid terminus and into the left MCA, suggestive of a severe supraclinoid ICA stenosis. The left M1 segment is grossly patent although poorly evaluated given the decreased signal. There are multiple patent left M2 branches in the sylvian fissure. Right M1 and proximal M2 segments are patent without significant stenosis. There is mild distal right MCA branch vessel irregularity. Right A1 segment is patent without stenosis. Anterior communicating artery is patent. Right ACA supplies the left A2.  IMPRESSION: 1. 4.7 cm posterior right temporal lobe mass with moderate surrounding edema. Considerations include metastasis from patient's known breast cancer or primary CNS neoplasm. Further evaluation with post-contrast brain MRI is recommended. 2. Mildly restricted diffusion involving posterior right insula and anterior right parietal cortex, which could reflect a small acute infarct or sequelae of recent seizure activity. 3. Mildly degraded head MRA. Decreased flow related enhancement in the distal left ICA and proximal left MCA concerning for high-grade, flow reducing proximal left supraclinoid ICA stenosis. 4. Suspected moderate to severe proximal left V4 vertebral artery stenosis. 5. Severe stenosis or short segment occlusion of the proximal left P2 segment.   Electronically Signed   By: Logan Bores    On: 11/08/2014 17:35      Follow up with PCP in 1 week.  Management plans discussed with the patient, family and they are in agreement.  CODE STATUS:   TOTAL TIME TAKING CARE OF THIS PATIENT ON DAY OF DISCHARGE: more than 30 minutes.    Hillary Bow R M.D on 11/09/2014 at 2:09 PM  Between 7am to 6pm - Pager - [phone]  After 6pm go to [email or web address] - password [email or web address] [facility]  [facility]  Office  [phone]  CC: Primary care physician; Adrian Prows, MD

## 2014-11-09 NOTE — Progress Notes (Signed)
ANTIBIOTIC CONSULT NOTE - INITIAL  Pharmacy Consult for Vancomycin,Levofloxacin  Indication: sepsis  Allergies  Allergen Reactions  . Penicillins Rash    Patient Measurements: Height: 5\' 8"  (172.7 cm) Weight: 221 lb 5.5 oz (100.4 kg) IBW/kg (Calculated) : 63.9 Adjusted Body Weight: 78.5 kg   Vital Signs: Temp: 98.7 F (37.1 C) (08/15 0513) Temp Source: Oral (08/15 0513) BP: 114/47 mmHg (08/15 0513) Pulse Rate: 87 (08/15 0513) Intake/Output from previous day:   Intake/Output from this shift:    Labs:  Recent Labs  11/08/14 0854  WBC 15.5*  HGB 14.5  PLT 269  CREATININE 0.95   Estimated Creatinine Clearance: 55.6 mL/min (by C-G formula based on Cr of 0.95). No results for input(s): VANCOTROUGH, VANCOPEAK, VANCORANDOM, GENTTROUGH, GENTPEAK, GENTRANDOM, TOBRATROUGH, TOBRAPEAK, TOBRARND, AMIKACINPEAK, AMIKACINTROU, AMIKACIN in the last 72 hours.   Microbiology: Recent Results (from the past 720 hour(s))  Blood culture (routine x 2)     Status: None   Collection Time: 11/02/14  6:21 PM  Result Value Ref Range Status   Specimen Description BLOOD RIGHT HAND  Final   Special Requests BAA,5ML,ANA,AER  Final   Culture NO GROWTH 7 DAYS  Final   Report Status 11/09/2014 FINAL  Final  Blood culture (routine x 2)     Status: None   Collection Time: 11/02/14  6:21 PM  Result Value Ref Range Status   Specimen Description BLOOD LEFT ARM  Final   Special Requests BAA,10ML,ANA,AER  Final   Culture NO GROWTH 7 DAYS  Final   Report Status 11/09/2014 FINAL  Final  Urine culture     Status: None   Collection Time: 11/02/14  6:21 PM  Result Value Ref Range Status   Specimen Description URINE, RANDOM  Final   Special Requests Normal  Final   Culture   Final    >=100,000 COLONIES/mL PROTEUS MIRABILIS >=100,000 COLONIES/mL ESCHERICHIA COLI    Report Status 11/06/2014 FINAL  Final   Organism ID, Bacteria PROTEUS MIRABILIS  Final   Organism ID, Bacteria ESCHERICHIA COLI  Final       Susceptibility   Escherichia coli - MIC*    AMPICILLIN 4 SENSITIVE Sensitive     CEFTAZIDIME <=1 SENSITIVE Sensitive     CEFAZOLIN <=4 SENSITIVE Sensitive     CEFTRIAXONE <=1 SENSITIVE Sensitive     CIPROFLOXACIN <=0.25 SENSITIVE Sensitive     GENTAMICIN <=1 SENSITIVE Sensitive     IMIPENEM <=0.25 SENSITIVE Sensitive     TRIMETH/SULFA <=20 SENSITIVE Sensitive     NITROFURANTOIN Value in next row Sensitive      SENSITIVE<=16    PIP/TAZO Value in next row Sensitive      SENSITIVE<=4    LEVOFLOXACIN Value in next row Sensitive      SENSITIVE<=0.12    * >=100,000 COLONIES/mL ESCHERICHIA COLI   Proteus mirabilis - MIC*    AMPICILLIN Value in next row Sensitive      SENSITIVE<=0.12    CEFTAZIDIME Value in next row Sensitive      SENSITIVE<=0.12    CEFAZOLIN Value in next row Sensitive      SENSITIVE<=0.12    CEFTRIAXONE Value in next row Sensitive      SENSITIVE<=0.12    CIPROFLOXACIN Value in next row Sensitive      SENSITIVE<=0.12    GENTAMICIN Value in next row Sensitive      SENSITIVE<=0.12    IMIPENEM Value in next row Sensitive      SENSITIVE<=0.12    TRIMETH/SULFA Value in next  row Sensitive      SENSITIVE<=0.12    NITROFURANTOIN Value in next row Resistant      RESISTANT128    PIP/TAZO Value in next row Sensitive      SENSITIVE<=4    AMPICILLIN/SULBACTAM Value in next row Sensitive      SENSITIVE<=2    * >=100,000 COLONIES/mL PROTEUS MIRABILIS  Blood Culture (routine x 2)     Status: None (Preliminary result)   Collection Time: 11/08/14  8:55 AM  Result Value Ref Range Status   Specimen Description BLOOD RIGHT ARM  Final   Special Requests   Final    BOTTLES DRAWN AEROBIC AND ANAEROBIC  AER 5CC ANA 2CC   Culture  Setup Time   Final    GRAM POSITIVE COCCI IN CLUSTERS AEROBIC BOTTLE ONLY CRITICAL RESULT CALLED TO, READ BACK BY AND VERIFIED WITH: ERIN PARDINI AT 0820 11/09/14 CTJ    Culture   Final    GRAM POSITIVE COCCI IN CLUSTERS AEROBIC BOTTLE  ONLY IDENTIFICATION TO FOLLOW    Report Status PENDING  Incomplete  Blood Culture (routine x 2)     Status: None (Preliminary result)   Collection Time: 11/08/14  9:00 AM  Result Value Ref Range Status   Specimen Description BLOOD LEFT FATTY CASTS  Final   Special Requests   Final    BOTTLES DRAWN AEROBIC AND ANAEROBIC  AER 4CC ANA 1CC   Culture  Setup Time   Final    GRAM POSITIVE COCCI IN CLUSTERS AEROBIC BOTTLE ONLY CRITICAL RESULT CALLED TO, READ BACK BY AND VERIFIED WITH: ERIN PARDINI AT 0820 11/09/14 CTJ    Culture   Final    GRAM POSITIVE COCCI IN CLUSTERS AEROBIC BOTTLE ONLY IDENTIFICATION TO FOLLOW    Report Status PENDING  Incomplete    Medical History: Past Medical History  Diagnosis Date  . Breast cancer 2015    positive w/ radiation  . Hypertension   . Depression   . Anxiety   . Sleep apnea   . Hypothyroidism   . Gout of right foot 10/21/14  . Gout of right foot   . Hypercholesterolemia 10/21/14  . Diabetes mellitus without complication     Medications:  Scheduled:  . aspirin  300 mg Rectal Daily  . heparin  5,000 Units Subcutaneous 3 times per day  . levofloxacin (LEVAQUIN) IV  750 mg Intravenous Q24H  . vancomycin  1,250 mg Intravenous Once   Assessment: Pharmacy consulted to dose vancomycin and levaquin for sepsis in this 81 year old female. Blood cultures with GPCs  Goal of Therapy:  Vancomycin trough level 15-20 mcg/ml  Plan:   Kel (hr-1): 0.05 Half-life (hrs): 14h Vd (liters): 55L  1. Patient previously started on vancomycin 1250 IV Q18H dose missed/discontinued overnight. Will resume dosing this morning with stacked dosing. Will give vancomycin 1250mg  IV x 1 now and follow with vancomycin 1250 IV Q18H to start in 10 hours. Will check trough prior to third dose which should be approaching steady state.   2. Continue levofloxacin 750mg  IV Q24H  Pharmacy to follow per consult  Rexene Edison, PharmD Clinical Pharmacist  11/09/2014,9:00  AM

## 2014-11-09 NOTE — Progress Notes (Signed)
Notified MD of positive blood culture result.

## 2014-11-09 NOTE — Evaluation (Signed)
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  Hospital course also significant for witnessed seizure activity; started on Keppra. 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    Clinical Impression   Pt seen for OT evaluation and presents sitting up in bed with body turned to right with inattention to left.  She is able to move BUEs but full UE assessment unable to be completed due to severe apraxia.  Pt has difficulty following one step commands with verbal and tactile cues for simple ADLs.  Assessed her ability to complete basic grooming skills and she required max assist and cues with hand over hand to comb hair and brush her dentures.  She could verbalize the task as she tried to complete it and then paused and could not sustain attention even with cues.  She demonstrated both expressive and receptive aphasia throughout assessment and could complete simple personal information only like name, address and which hand was dominant.  She kept repeating "is the year 2015 or 2016"?  Rec continued OT services for repetition for learning, standing to her left to increase awareness of left side, tactile and verbal cues to complete ADLs, and family ed and training.       Follow Up Recommendations   (DC plan depends on rec for L temporal mass intervention and family )    Equipment Recommendations       Recommendations for Other Services       Precautions / Restrictions Precautions Precautions: Fall Restrictions Weight Bearing Restrictions: No      Mobility Bed Mobility Overal bed mobility: Needs Assistance Bed Mobility: Supine to Sit     Supine to sit: Mod assist     General bed mobility  comments: Pt was able to assist with UE/LE but she is not at the strength that she needs to be at.  Pt demonstrates poor trunk control and requires mod assist for bed scoot to the EOB.   Transfers Overall transfer level: Needs assistance Equipment used: Rolling walker (2 wheeled) Transfers: Sit to/from Stand Sit to Stand: +2 physical assistance;Mod assist;Max assist         General transfer comment: Pt was able to stand up with 2+ mod-max assist with verbal/tactile cuing to lean forward secondary to posterior trunk lean.     Balance Overall balance assessment: Needs assistance Sitting-balance support: No upper extremity supported;Single extremity supported Sitting balance-Leahy Scale: Good Sitting balance - Comments: Pt was able to comb hair and hold pen while sitting unsopported at the EOB.    Standing balance support: Bilateral upper extremity supported Standing balance-Leahy Scale: Poor Standing balance comment: Pt demonstrates posterior trunk lean and he LEs mad contact with the bed for additional support.  Pt was only able to stand up for ~15sec and had to sit down due to fatigue.                              ADL Overall ADL's : Needs assistance/impaired  General ADL Comments: Pt is very apraxic and needs assist for all areas of ADLs.  She required extra time and max cues to remove dentures for oral hygiene but could not initaite using toothbrush to clean before returning them to her mouth even with verbal and tactile cues.  Unable to complete combing her hair and resited hand over hand to initaite and could not complete task started by OT.  max assist to transition from lying to sitting at EOB and half way through tasks she lost attention and brought legs back up onto bed.       Vision     Perception     Praxis Praxis Praxis tested?: Deficits Deficits: Initiation;Perseveration;Organization;Ideomotor     Pertinent Vitals/Pain Pain Assessment: No/denies pain     Hand Dominance Left   Extremity/Trunk Assessment Upper Extremity Assessment Upper Extremity Assessment: Difficult to assess due to impaired cognition (difficulty following one step commands and verbal cues to imitiate therapist to fully assess)   Lower Extremity Assessment Lower Extremity Assessment: Defer to PT evaluation       Communication Communication Communication: Expressive difficulties;Receptive difficulties;Other (comment) (apraxia and perseveration on date)   Cognition Arousal/Alertness: Awake/alert Behavior During Therapy: Flat affect Overall Cognitive Status: Impaired/Different from baseline Area of Impairment: Orientation;Attention;Memory;Following commands;Safety/judgement;Problem solving Orientation Level: Disoriented to;Time   Memory: Decreased short-term memory   Safety/Judgement: Decreased awareness of safety   Problem Solving: Slow processing;Decreased initiation;Difficulty sequencing;Requires verbal cues;Requires tactile cues General Comments: Pt is very apraxic and has difficulty with one step commands even with tactile and verbal cues.  Perseverative about what year it is and thinks it is 2015 or 2016. and knows she is at [facility].   General Comments       Exercises   Other Exercises Other Exercises: Supine bilat ankle pumps/heel slides, 1 x 5, pt required constant cuing in order to initiate desired movement secondary to L inattention and suspected apraxia affecting her movement sequencing.  Other Exercises: Educated family on positioning to L of bedside when visiting to promote cervical rotation and visual scanning to L of midline.  Son voiced understanding. Other Exercises: Provided patient with 3 common objects (comb, toothbrush and pen).  Patient able to verbalize use of 2/3 (but verbally perseverative), demonstrate use of 1/3.  Performance significant for functional motor apraxia.   improved use of L UE with automatic tasks vs. isolated, on-command tasks.   Shoulder Instructions      Home Living Family/patient expects to be discharged to:: Private residence Living Arrangements: Children Available Help at Discharge: Family Type of Home: House Home Access: [place]: [place]: Environmental consultant - 4 wheels;Hospital bed;Wheelchair - manual          Prior Functioning/Environment Level of Independence: Needs assistance        Comments: Per patient's son's report with PT, she requires assist from son for all functional mobility and ADLs (unable to quantify).  Typically completes SPT between seating surfaces (with RW), and uses manual WC for additional household mobility.  Incontinent at baseline; son assists with hygiene.    OT Diagnosis: Generalized weakness;Apraxia   OT Problem List:  (moderate to severe apraxia)   OT Treatment/Interventions: Self-care/ADL training;Patient/family education    OT Goals(Current goals can be found in the care plan section) Acute Rehab OT Goals Patient Stated Goal: Pt not able to state a goal this session  OT Goal Formulation: With patient Time For Goal Achievement: 11/23/14 Potential to Achieve Goals: Fair  OT Frequency: Min 1X/week   Barriers to D/C:            Co-evaluation              End of Session    Activity Tolerance: Patient tolerated treatment well Patient left: in bed;with bed alarm set;with chair alarm set   Time: 1415-1500 OT Time Calculation (min): 45 min Charges:  OT General Charges $OT Visit: 1 Procedure OT Evaluation $Initial OT Evaluation Tier I: 1 Procedure OT Treatments $Self Care/Home Management : 8-22 mins $Therapeutic Activity: 8-22 mins G-Codes:    Wofford,Susan 11/28/2014, 3:23 PM    Chrys Racer, OTR/L

## 2014-11-09 NOTE — Consult Note (Signed)
[place] @ [facility] [phone]  Fax:([phone]) Hustler Palmer OB: Aug 11, 1929  MR#: [id number]  QGB#:[id number]  Patient Care Team: Adrian Prows, MD as PCP - General (Infectious Diseases)  CHIEF COMPLAINT:  Chief Complaint  Patient presents with  . Recurrent UTI   1.  Admitted in the hospital   being unconscious for last few days.  MRI scan shows a right temporal mass Multiple area of infarct , VISIT DIAGNOSIS:     ICD-9-CM ICD-10-CM   1. Acute CVA (cerebrovascular accident) 434.91 I63.9 dexamethasone (DECADRON) injection 10 mg     levETIRAcetam (KEPPRA) tablet 750 mg  2. Sepsis, due to unspecified organism 038.9 A41.9    995.91    3. Complicated UTI (urinary tract infection) 599.0 N39.0   4. Unresponsive state 780.09 R40.4   5. Stroke 434.91 I63.9 US Carotid Bilateral     US Carotid Bilateral     dexamethasone (DECADRON) injection 10 mg     levETIRAcetam (KEPPRA) tablet 750 mg      No history exists.    Oncology Flowsheet 11/02/2014 11/03/2014 11/04/2014 11/05/2014  enoxaparin (LOVENOX) [place] 40 mg 40 mg 40 mg -  letrozole (FEMARA) PO - 2.5 mg - 2.5 mg    INTERVAL HISTORY: Patient has been diagnosed to have carcinoma breast in February 6 of 2015 invasive mammary carcinoma and ductal carcinoma in situ ER positive PR positive initially was staged as T1b N0 M0 stage I disease patient had lumpectomy and radiation therapy and was started on letrozole.. Patient presented to emergency room on 14th of August with complaints of being unconscious for a few days. No witnessed seizure activity.  Patient is very poor historian.  So most of the history has been off pain reviewing chart.  There was some mention about patient getting ECT on July 24 and has been canceled. Patient does not remember actually what happened to her.  Trying to talk but not able to express herself.  Patient underwent CT scan of chest abdomen pelvis also MRI scan of brain.  Also  had down Doppler study of both carotid artery but left carotid artery was not visualized. CT scan and MRI scan revealed multiple area of subacute infarct. MRI scan revealed right temporal lobe mass I was asked to evaluate patient at that point in time.  REVIEW OF SYSTEMS:   Patient should not is poor historian.  Most of the history has been reviewed from patient's multiple admission chart and my old records.  As per HPI. Otherwise, a complete review of systems is negatve.  PAST MEDICAL HISTORY: Past Medical History  Diagnosis Date  . Breast cancer 2015    positive w/ radiation  . Hypertension   . Depression   . Anxiety   . Sleep apnea   . Hypothyroidism   . Gout of right foot 10/21/14  . Gout of right foot   . Hypercholesterolemia 10/21/14  . Diabetes mellitus without complication     PAST SURGICAL HISTORY: Past Surgical History  Procedure Laterality Date  . Eye surgery    . Breast surgery    . Hernia repair Right 10/21/14    FAMILY HISTORY Family History  Problem Relation Age of Onset  . Hypertension Son   . CVA Mother   . Heart disease Father        ADVANCED DIRECTIVES:   Not available, I will discuss with the family HEALTH MAINTENANCE: Social History  Substance Use Topics  . Smoking status: Former  Smoker    Types: Cigarettes    Quit date: 10/21/1974  . Smokeless tobacco: None  . Alcohol Use: No      Allergies  Allergen Reactions  . Penicillins Rash    Current Facility-Administered Medications  Medication Dose Route Frequency Provider Last Rate Last Dose  . 0.9 %  sodium chloride infusion   Intravenous Continuous Demetrios Loll, MD 50 mL/hr at 11/08/14 1934    . aspirin suppository 300 mg  300 mg Rectal Daily Demetrios Loll, MD   300 mg at 11/09/14 0854  . dexamethasone (DECADRON) injection 10 mg  10 mg Intravenous Q12H Forest Gleason, MD      . heparin injection 5,000 Units  5,000 Units Subcutaneous 3 times per day Demetrios Loll, MD   5,000 Units at 11/09/14 0515    . levETIRAcetam (KEPPRA) tablet 750 mg  750 mg Oral BID Forest Gleason, MD      . levofloxacin (LEVAQUIN) IVPB 750 mg  750 mg Intravenous Q24H Hinda Kehr, MD   750 mg at 11/09/14 1227  . senna-docusate (Senokot-S) tablet 1 tablet  1 tablet Oral QHS PRN Demetrios Loll, MD      . vancomycin (VANCOCIN) 1,250 mg in sodium chloride 0.9 % 250 mL IVPB  1,250 mg Intravenous Q18H Max Sane, MD        OBJECTIVE: PHYSICAL EXAM Patient is lying in the bed.  Has increasing difficulty with expressing herself. Dragon normal limit. Right breast shows some induration at the site of the previous lumpectomy. Neurological system difficult to evaluate.  Some weakness on the right side Speech is impaired Abdominal exam revealed normal bowel sounds. The abdomen was soft, non-tender, and without masses, organomegaly, or appreciable enlargement of the abdominal aorta. Examination of the skin revealed no evidence of significant rashes, suspicious appearing nevi or other concerning lesions. Cardiac exam revealed the PMI to be normally situated and sized. The rhythm was regular and no extrasystoles were noted during several minutes of auscultation. The first and second heart sounds were normal and physiologic splitting of the second heart sound was noted. There were no murmurs, rubs, clicks, or gallops. Examination of the chest was unremarkable. There were no bony deformities, no asymmetry, and no other abnormalities. Lower extremity no edema,  Filed Vitals:   11/09/14 0513  BP: 114/47  Pulse: 87  Temp: 98.7 F (37.1 C)  Resp: 18     Body mass index is 33.66 kg/(m^2).    ECOG FS:2 - Symptomatic, <50% confined to bed  LAB RESULTS:  Admission on 11/08/2014  Component Date Value Ref Range Status  . Lactic Acid, Venous 11/08/2014 1.3  0.5 - 2.0 mmol/L Final  . Sodium 11/08/2014 139  135 - 145 mmol/L Final  . Potassium 11/08/2014 4.6  3.5 - 5.1 mmol/L Final  . Chloride 11/08/2014 102  101 - 111 mmol/L Final  . CO2  11/08/2014 26  22 - 32 mmol/L Final  . Glucose, Bld 11/08/2014 104* 65 - 99 mg/dL Final  . BUN 11/08/2014 19  6 - 20 mg/dL Final  . Creatinine, Ser 11/08/2014 0.95  0.44 - 1.00 mg/dL Final  . Calcium 11/08/2014 10.0  8.9 - 10.3 mg/dL Final  . Total Protein 11/08/2014 7.6  6.5 - 8.1 g/dL Final  . Albumin 11/08/2014 3.6  3.5 - 5.0 g/dL Final  . AST 11/08/2014 38  15 - 41 U/L Final  . ALT 11/08/2014 23  14 - 54 U/L Final  . Alkaline Phosphatase 11/08/2014 122  38 -  126 U/L Final  . Total Bilirubin 11/08/2014 0.4  0.3 - 1.2 mg/dL Final  . GFR calc non Af Amer 11/08/2014 54* >60 mL/min Final  . GFR calc Af Amer 11/08/2014 >60  >60 mL/min Final   Comment: (NOTE) The eGFR has been calculated using the CKD EPI equation. This calculation has not been validated in all clinical situations. eGFR's persistently <60 mL/min signify possible Chronic Kidney Disease.   . Anion gap 11/08/2014 11  5 - 15 Final  . Lipase 11/08/2014 14* 22 - 51 U/L Final  . Troponin I 11/08/2014 <0.03  <0.031 ng/mL Final   Comment:        NO INDICATION OF MYOCARDIAL INJURY.   . WBC 11/08/2014 15.5* 3.6 - 11.0 K/uL Final  . RBC 11/08/2014 5.03  3.80 - 5.20 MIL/uL Final  . Hemoglobin 11/08/2014 14.5  12.0 - 16.0 g/dL Final  . HCT 11/08/2014 45.5  35.0 - 47.0 % Final  . MCV 11/08/2014 90.5  80.0 - 100.0 fL Final  . MCH 11/08/2014 28.9  26.0 - 34.0 pg Final  . MCHC 11/08/2014 31.9* 32.0 - 36.0 g/dL Final  . RDW 11/08/2014 15.7* 11.5 - 14.5 % Final  . Platelets 11/08/2014 269  150 - 440 K/uL Final  . Neutrophils Relative % 11/08/2014 68   Final  . Neutro Abs 11/08/2014 10.6* 1.4 - 6.5 K/uL Final  . Lymphocytes Relative 11/08/2014 24   Final  . Lymphs Abs 11/08/2014 3.6  1.0 - 3.6 K/uL Final  . Monocytes Relative 11/08/2014 6   Final  . Monocytes Absolute 11/08/2014 0.9  0.2 - 0.9 K/uL Final  . Eosinophils Relative 11/08/2014 1   Final  . Eosinophils Absolute 11/08/2014 0.1  0 - 0.7 K/uL Final  . Basophils Relative  11/08/2014 1   Final  . Basophils Absolute 11/08/2014 0.1  0 - 0.1 K/uL Final  . aPTT 11/08/2014 29  24 - 36 seconds Final  . Prothrombin Time 11/08/2014 14.2  11.4 - 15.0 seconds Final  . INR 11/08/2014 1.08   Final  . Specimen Description 11/08/2014 BLOOD RIGHT ARM   Final  . Special Requests 11/08/2014 BOTTLES DRAWN AEROBIC AND ANAEROBIC  AER 5CC ANA 2CC   Final  . Culture  Setup Time 11/08/2014    Final                   Value:GRAM POSITIVE COCCI IN CLUSTERS AEROBIC BOTTLE ONLY CRITICAL RESULT CALLED TO, READ BACK BY AND VERIFIED WITH: ERIN PARDINI AT 0820 11/09/14 CTJ   . Culture 11/08/2014    Final                   Value:GRAM POSITIVE COCCI IN CLUSTERS AEROBIC BOTTLE ONLY IDENTIFICATION TO FOLLOW   . Report Status 11/08/2014 PENDING   Incomplete  . Specimen Description 11/08/2014 BLOOD LEFT FATTY CASTS   Final  . Special Requests 11/08/2014 BOTTLES DRAWN AEROBIC AND ANAEROBIC  AER 4CC ANA 1CC   Final  . Culture  Setup Time 11/08/2014    Final                   Value:GRAM POSITIVE COCCI IN CLUSTERS AEROBIC BOTTLE ONLY CRITICAL RESULT CALLED TO, READ BACK BY AND VERIFIED WITH: ERIN PARDINI AT 0820 11/09/14 CTJ   . Culture 11/08/2014    Final                   Value:GRAM POSITIVE COCCI IN CLUSTERS AEROBIC BOTTLE  ONLY IDENTIFICATION TO FOLLOW   . Report Status 11/08/2014 PENDING   Incomplete  . Color, Urine 11/08/2014 AMBER* YELLOW Final  . APPearance 11/08/2014 CLOUDY* CLEAR Final  . Glucose, UA 11/08/2014 NEGATIVE  NEGATIVE mg/dL Final  . Bilirubin Urine 11/08/2014 NEGATIVE  NEGATIVE Final  . Ketones, ur 11/08/2014 TRACE* NEGATIVE mg/dL Final  . Specific Gravity, Urine 11/08/2014 1.025  1.005 - 1.030 Final  . Hgb urine dipstick 11/08/2014 3+* NEGATIVE Final  . pH 11/08/2014 5.0  5.0 - 8.0 Final  . Protein, ur 11/08/2014 30* NEGATIVE mg/dL Final  . Nitrite 11/08/2014 NEGATIVE  NEGATIVE Final  . Leukocytes, UA 11/08/2014 2+* NEGATIVE Final  . RBC / HPF 11/08/2014 TOO  NUMEROUS TO COUNT  0 - 5 RBC/hpf Final  . WBC, UA 11/08/2014 TOO NUMEROUS TO COUNT  0 - 5 WBC/hpf Final  . Bacteria, UA 11/08/2014 RARE* NONE SEEN Final  . Squamous Epithelial / LPF 11/08/2014 0-5* NONE SEEN Final  . WBC Clumps 11/08/2014 PRESENT   Final  . Mucous 11/08/2014 PRESENT   Final  . Specimen Description 11/08/2014 URINE, RANDOM   Final  . Special Requests 11/08/2014 Normal   Final  . Culture 11/08/2014 MULTIPLE SPECIES PRESENT, SUGGEST RECOLLECTION   Final  . Report Status 11/08/2014 11/09/2014 FINAL   Final  . Lactic Acid, Venous 11/08/2014 1.1  0.5 - 2.0 mmol/L Final  . Cholesterol 11/09/2014 119  0 - 200 mg/dL Final  . Triglycerides 11/09/2014 119  <150 mg/dL Final  . HDL 11/09/2014 33* >40 mg/dL Final  . Total CHOL/HDL Ratio 11/09/2014 3.6   Final  . VLDL 11/09/2014 24  0 - 40 mg/dL Final  . LDL Cholesterol 11/09/2014 62  0 - 99 mg/dL Final   Comment:        Total Cholesterol/HDL:CHD Risk Coronary Heart Disease Risk Table                     Men   Women  1/2 Average Risk   3.4   3.3  Average Risk       5.0   4.4  2 X Average Risk   9.6   7.1  3 X Average Risk  23.4   11.0        Use the calculated Patient Ratio above and the CHD Risk Table to determine the patient's CHD Risk.        ATP III CLASSIFICATION (LDL):  <100     mg/dL   Optimal  100-129  mg/dL   Near or Above                    Optimal  130-159  mg/dL   Borderline  160-189  mg/dL   High  >190     mg/dL   Very High   Admission on 11/02/2014, Discharged on 11/05/2014  Component Date Value Ref Range Status  . Sodium 11/02/2014 131* 135 - 145 mmol/L Final  . Potassium 11/02/2014 4.3  3.5 - 5.1 mmol/L Final  . Chloride 11/02/2014 96* 101 - 111 mmol/L Final  . CO2 11/02/2014 22  22 - 32 mmol/L Final  . Glucose, Bld 11/02/2014 128* 65 - 99 mg/dL Final  . BUN 11/02/2014 15  6 - 20 mg/dL Final  . Creatinine, Ser 11/02/2014 1.02* 0.44 - 1.00 mg/dL Final  . Calcium 11/02/2014 10.1  8.9 - 10.3 mg/dL Final    . GFR calc non Af Amer 11/02/2014 49* >60 mL/min Final  . GFR  calc Af Amer 11/02/2014 57* >60 mL/min Final   Comment: (NOTE) The eGFR has been calculated using the CKD EPI equation. This calculation has not been validated in all clinical situations. eGFR's persistently <60 mL/min signify possible Chronic Kidney Disease.   . Anion gap 11/02/2014 13  5 - 15 Final  . WBC 11/02/2014 16.1* 3.6 - 11.0 K/uL Final  . RBC 11/02/2014 5.12  3.80 - 5.20 MIL/uL Final  . Hemoglobin 11/02/2014 14.9  12.0 - 16.0 g/dL Final  . HCT 11/02/2014 45.7  35.0 - 47.0 % Final  . MCV 11/02/2014 89.2  80.0 - 100.0 fL Final  . MCH 11/02/2014 29.0  26.0 - 34.0 pg Final  . MCHC 11/02/2014 32.5  32.0 - 36.0 g/dL Final  . RDW 11/02/2014 15.4* 11.5 - 14.5 % Final  . Platelets 11/02/2014 305  150 - 440 K/uL Final  . Color, Urine 11/02/2014 AMBER* YELLOW Final  . APPearance 11/02/2014 CLOUDY* CLEAR Final  . Glucose, UA 11/02/2014 NEGATIVE  NEGATIVE mg/dL Final  . Bilirubin Urine 11/02/2014 NEGATIVE  NEGATIVE Final  . Ketones, ur 11/02/2014 TRACE* NEGATIVE mg/dL Final  . Specific Gravity, Urine 11/02/2014 1.020  1.005 - 1.030 Final  . Hgb urine dipstick 11/02/2014 1+* NEGATIVE Final  . pH 11/02/2014 7.0  5.0 - 8.0 Final  . Protein, ur 11/02/2014 100* NEGATIVE mg/dL Final  . Nitrite 11/02/2014 POSITIVE* NEGATIVE Final  . Leukocytes, UA 11/02/2014 1+* NEGATIVE Final  . RBC / HPF 11/02/2014 TOO NUMEROUS TO COUNT  0 - 5 RBC/hpf Final  . WBC, UA 11/02/2014 TOO NUMEROUS TO COUNT  0 - 5 WBC/hpf Final  . Bacteria, UA 11/02/2014 RARE* NONE SEEN Final  . Squamous Epithelial / LPF 11/02/2014 0-5* NONE SEEN Final  . Mucous 11/02/2014 PRESENT   Final  . Glucose-Capillary 11/02/2014 126* 65 - 99 mg/dL Final  . Specimen Description 11/02/2014 BLOOD RIGHT HAND   Final  . Special Requests 11/02/2014 BAA,5ML,ANA,AER   Final  . Culture 11/02/2014 NO GROWTH 7 DAYS   Final  . Report Status 11/02/2014 11/09/2014 FINAL   Final  .  Specimen Description 11/02/2014 BLOOD LEFT ARM   Final  . Special Requests 11/02/2014 BAA,10ML,ANA,AER   Final  . Culture 11/02/2014 NO GROWTH 7 DAYS   Final  . Report Status 11/02/2014 11/09/2014 FINAL   Final  . Lactic Acid, Venous 11/02/2014 1.9  0.5 - 2.0 mmol/L Final  . Lactic Acid, Venous 11/02/2014 1.3  0.5 - 2.0 mmol/L Final  . Specimen Description 11/02/2014 URINE, RANDOM   Final  . Special Requests 11/02/2014 Normal   Final  . Culture 11/02/2014    Final                   Value:>=100,000 COLONIES/mL PROTEUS MIRABILIS >=100,000 COLONIES/mL ESCHERICHIA COLI   . Report Status 11/02/2014 11/06/2014 FINAL   Final  . Organism ID, Bacteria 11/02/2014 PROTEUS MIRABILIS   Final  . Organism ID, Bacteria 11/02/2014 ESCHERICHIA COLI   Final  . Sodium 11/03/2014 137  135 - 145 mmol/L Final  . Potassium 11/03/2014 4.3  3.5 - 5.1 mmol/L Final  . Chloride 11/03/2014 105  101 - 111 mmol/L Final  . CO2 11/03/2014 24  22 - 32 mmol/L Final  . Glucose, Bld 11/03/2014 79  65 - 99 mg/dL Final  . BUN 11/03/2014 15  6 - 20 mg/dL Final  . Creatinine, Ser 11/03/2014 0.86  0.44 - 1.00 mg/dL Final  . Calcium 11/03/2014 9.4  8.9 - 10.3  mg/dL Final  . GFR calc non Af Amer 11/03/2014 >60  >60 mL/min Final  . GFR calc Af Amer 11/03/2014 >60  >60 mL/min Final   Comment: (NOTE) The eGFR has been calculated using the CKD EPI equation. This calculation has not been validated in all clinical situations. eGFR's persistently <60 mL/min signify possible Chronic Kidney Disease.   . Anion gap 11/03/2014 8  5 - 15 Final  . WBC 11/03/2014 12.4* 3.6 - 11.0 K/uL Final  . RBC 11/03/2014 4.35  3.80 - 5.20 MIL/uL Final  . Hemoglobin 11/03/2014 12.5  12.0 - 16.0 g/dL Final  . HCT 11/03/2014 39.2  35.0 - 47.0 % Final  . MCV 11/03/2014 90.2  80.0 - 100.0 fL Final  . MCH 11/03/2014 28.7  26.0 - 34.0 pg Final  . MCHC 11/03/2014 31.8* 32.0 - 36.0 g/dL Final  . RDW 11/03/2014 15.4* 11.5 - 14.5 % Final  . Platelets  11/03/2014 241  150 - 440 K/uL Final  . Glucose-Capillary 11/04/2014 91  65 - 99 mg/dL Final     STUDIES: Dg Chest 2 View  11/02/2014   CLINICAL DATA:  Dizziness, evaluate for pneumonia  EXAM: CHEST  2 VIEW  COMPARISON:  02/04/2013  FINDINGS: There is no focal parenchymal opacity. There is no pleural effusion or pneumothorax. There is mild stable cardiomegaly.  There is bilateral glenohumeral osteoarthritis.  IMPRESSION: No active cardiopulmonary disease.   Electronically Signed   By: Kathreen Devoid   On: 11/02/2014 17:49   Ct Head Wo Contrast  11/08/2014   CLINICAL DATA:  Head injury and loss of consciousness after fall.  EXAM: CT HEAD WITHOUT CONTRAST  CT CERVICAL SPINE WITHOUT CONTRAST  TECHNIQUE: Multidetector CT imaging of the head and cervical spine was performed following the standard protocol without intravenous contrast. Multiplanar CT image reconstructions of the cervical spine were also generated.  COMPARISON:  None.  FINDINGS: CT HEAD FINDINGS  Bony calvarium appears intact. Sulcal effacement and large area of low density is seen involving the right posterior parietal cortex concerning for acute infarction. No midline shift is noted. Ventricular size is within normal limits. No hemorrhage is noted.  CT CERVICAL SPINE FINDINGS  Reversal of normal lordosis of cervical spine is noted, most likely due to severe degenerative disc disease present at C4-5, C5-6 and C6-7. Moderate degenerative disc disease is also present at C3-4. No fracture or spondylolisthesis is noted. Mild bilateral posterior facet joint hypertrophy is noted in the upper cervical spine. Visualized lung apices appear normal.  IMPRESSION: Probable large area of acute infarction involving the right posterior parietal cortex. MRI may be performed for further evaluation.  Severe multilevel degenerative disc disease is noted in the cervical spine. No fracture or spondylolisthesis is noted.   Electronically Signed   By: Marijo Conception,  M.D.   On: 11/08/2014 10:32   Ct Cervical Spine Wo Contrast  11/08/2014   CLINICAL DATA:  Head injury and loss of consciousness after fall.  EXAM: CT HEAD WITHOUT CONTRAST  CT CERVICAL SPINE WITHOUT CONTRAST  TECHNIQUE: Multidetector CT imaging of the head and cervical spine was performed following the standard protocol without intravenous contrast. Multiplanar CT image reconstructions of the cervical spine were also generated.  COMPARISON:  None.  FINDINGS: CT HEAD FINDINGS  Bony calvarium appears intact. Sulcal effacement and large area of low density is seen involving the right posterior parietal cortex concerning for acute infarction. No midline shift is noted. Ventricular size is within normal limits. No  hemorrhage is noted.  CT CERVICAL SPINE FINDINGS  Reversal of normal lordosis of cervical spine is noted, most likely due to severe degenerative disc disease present at C4-5, C5-6 and C6-7. Moderate degenerative disc disease is also present at C3-4. No fracture or spondylolisthesis is noted. Mild bilateral posterior facet joint hypertrophy is noted in the upper cervical spine. Visualized lung apices appear normal.  IMPRESSION: Probable large area of acute infarction involving the right posterior parietal cortex. MRI may be performed for further evaluation.  Severe multilevel degenerative disc disease is noted in the cervical spine. No fracture or spondylolisthesis is noted.   Electronically Signed   By: Marijo Conception, M.D.   On: 11/08/2014 10:32   Mr Brain Wo Contrast  11/08/2014   CLINICAL DATA:  Found unresponsive 3 days ago. Concern for acute stroke. History of right breast cancer.  EXAM: MRI HEAD WITHOUT CONTRAST  MRA HEAD WITHOUT CONTRAST  TECHNIQUE: Multiplanar, multiecho pulse sequences of the brain and surrounding structures were obtained without intravenous contrast. Angiographic images of the head were obtained using MRA technique without contrast.  COMPARISON:  Head CT 11/08/2014  FINDINGS:  MRI HEAD FINDINGS  There is cortically based, mildly restricted diffusion involving the posterior right insula and anterior right parietal lobe without definite corresponding T2 signal abnormality. There is a heterogeneously T2 hyperintense right posterior temporal intra-axial mass measuring 4.7 x 3.8 cm with heterogeneous areas of internal restricted diffusion. There is moderate surrounding vasogenic edema in the temporal, occipital, and parietal lobes with mass effect on the temporal and occipital horns of the right lateral ventricle. There is no midline shift.  There is mild-to-moderate cerebral atrophy, particularly in the left perisylvian region. No intracranial hemorrhage or extra-axial fluid collection is seen.  Prior bilateral cataract extraction is noted. Minimal posterior left ethmoid air cell mucosal thickening is present. Mastoid air cells are clear. Major intracranial vascular flow voids are preserved.  MRA HEAD FINDINGS  The study is mildly motion degraded.  Visualized distal vertebral arteries are patent with the left being slightly dominant. Evaluation of the proximal to mid V4 segments is partly limited due to technical factors with decreased signal in this region. There is suggestion of a moderate to severe proximal left V4 stenosis. PICA origins are not clearly identified. Right AICA and bilateral SCA origins are patent. Basilar artery is patent without stenosis. PCA origins are patent. There is no significant proximal PCA stenosis, although there may be mild to moderate mid right P2 stenosis. The left P1 segment is patent without significant stenosis, however there is an apparent severe stenosis/ short segment occlusion of the proximal left P2 segment with reconstitution distally. Left PCA branch vessels distal the this are attenuated.  The intracranial right ICA is patent with mild supraclinoid stenosis. Intracranial left ICA appears mildly small in caliber diffusely compared to the right which  may be developmental as the left A1 segment appears to be either absent or severely hypoplastic. There is diminished flow related enhancement in the distal left carotid siphon which progressively decreases with only a very small amount of signal at the carotid terminus and into the left MCA, suggestive of a severe supraclinoid ICA stenosis. The left M1 segment is grossly patent although poorly evaluated given the decreased signal. There are multiple patent left M2 branches in the sylvian fissure. Right M1 and proximal M2 segments are patent without significant stenosis. There is mild distal right MCA branch vessel irregularity. Right A1 segment is patent without stenosis. Anterior communicating  artery is patent. Right ACA supplies the left A2.  IMPRESSION: 1. 4.7 cm posterior right temporal lobe mass with moderate surrounding edema. Considerations include metastasis from patient's known breast cancer or primary CNS neoplasm. Further evaluation with post-contrast brain MRI is recommended. 2. Mildly restricted diffusion involving posterior right insula and anterior right parietal cortex, which could reflect a small acute infarct or sequelae of recent seizure activity. 3. Mildly degraded head MRA. Decreased flow related enhancement in the distal left ICA and proximal left MCA concerning for high-grade, flow reducing proximal left supraclinoid ICA stenosis. 4. Suspected moderate to severe proximal left V4 vertebral artery stenosis. 5. Severe stenosis or short segment occlusion of the proximal left P2 segment.   Electronically Signed   By: Logan Bores   On: 11/08/2014 17:35   Ct Abdomen Pelvis W Contrast  11/08/2014   CLINICAL DATA:  81 year old female found unresponsive. Sepsis. Recent urinary tract infection. Initial encounter.  EXAM: CT ABDOMEN AND PELVIS WITH CONTRAST  TECHNIQUE: Multidetector CT imaging of the abdomen and pelvis was performed using the standard protocol following bolus administration of  intravenous contrast.  CONTRAST:  180m OMNIPAQUE IOHEXOL 350 MG/ML SOLN  COMPARISON:  Lumbar MRI 02/01/2013.  FINDINGS: Cardiomegaly. No pericardial effusion. Bilateral lung base scarring or atelectasis. No pleural effusion or lung base consolidation.  Advanced degenerative changes in the spine. Osteopenia. No acute osseous abnormality identified.  No pelvic free fluid. Negative uterus and adnexa. Negative rectum aside from mild retained stool.  The bladder is mildly distended and there is perivesical stranding (series 2, image 76). There is a 2-3 cm diverticulum at the bladder dome (series 2, image 69 and sagittal image 87). Negative distal left ureters. No gas within the bladder.  Large body habitus and laxity of the ventral abdominal wall. Redundant sigmoid colon. Proximal sigmoid diverticulosis but no active inflammation. Negative left colon. Negative transverse colon. Redundant right colon. The entire right lateral abdomen wall is not included due to large body habitus. No large bowel inflammation identified. Negative terminal ileum. Appendix not identified. No dilated small bowel. No ventral abdominal hernia identified. Postoperative changes to the right ventral abdominal wall. Negative stomach.  Liver, spleen, pancreas (atrophied), and adrenal glands are within normal limits. The gallbladder appears to be surgically absent. The portal venous system is patent. Extensive Aortoiliac calcified atherosclerosis noted. Major arterial structures are patent. No abdominal free fluid. No lymphadenopathy. Nonobstructing right midpole 11 mm calculus. Otherwise normal for age kidneys. Renal contrast enhancement and excretion is symmetric.  There is a 4.5 cm round and spiculated intermediate density (40 Hounsfield units) right breast mass. See coronal image 83.  IMPRESSION: 1. Bladder wall inflammation compatible with urinary infection. 2-3 cm bladder fundus diverticulum. Kidneys are negative aside from nonobstructing  right nephrolithiasis. 2. 4.5 cm right breast mass. Recommend mammographic correlation once the patient recovers from the acute illness. 3. No other acute or inflammatory process in the abdomen or pelvis. 4. Extensive calcified aortic atherosclerosis. Extensive spine degeneration.   Electronically Signed   By: HGenevie AnnM.D.   On: 11/08/2014 11:52   [place]ot[place] Bilateral  11/08/2014   CLINICAL DATA:  Unresponsive, right temporal mass  EXAM: BILATERAL CAROTID DUPLEX ULTRASOUND  TECHNIQUE: GPearline Cablesscale imaging, color Doppler and duplex ultrasound were performed of bilateral carotid and vertebral arteries in the neck. The examination is limited is a patient did not complete the left half of the study.  COMPARISON:  None.  FINDINGS: Criteria: Quantification of carotid stenosis is based  on velocity parameters that correlate the residual internal carotid diameter with NASCET-based stenosis levels, using the diameter of the distal internal carotid lumen as the denominator for stenosis measurement.  The following velocity measurements were obtained:  RIGHT  ICA:  176/44 cm/sec  CCA:  76/54 cm/sec  SYSTOLIC ICA/CCA RATIO:  3.2  DIASTOLIC ICA/CCA RATIO:  2.9  ECA:  248 cm/sec  LEFT  Not completed at the patient's wishes  RIGHT CAROTID ARTERY: Diffuse atherosclerotic plaque is noted in the region of the carotid bulb and proximal internal carotid artery. The waveforms, velocities and flow velocity ratios suggest a stenosis in the 50-69% range.  RIGHT VERTEBRAL ARTERY:  Antegrade in nature.  IMPRESSION: 50-69% stenosis in the right proximal internal carotid artery.  Patient refused further imaging and the left carotid artery was not interrogated.   Electronically Signed   By: Inez Catalina M.D.   On: 11/08/2014 18:41   Dg Chest Port 1 View  11/08/2014   CLINICAL DATA:  Sepsis.  EXAM: PORTABLE CHEST - 1 VIEW  COMPARISON:  November 02, 2014.  FINDINGS: Stable cardiomegaly. No pneumothorax or significant pleural effusion is noted.  Right lung is clear. Mild left basilar opacity is noted concerning for subsegmental atelectasis or scarring. Severe degenerative changes seen involving the glenohumeral joints bilaterally.  IMPRESSION: Mild left basilar subsegmental atelectasis or scarring. Stable cardiomegaly.   Electronically Signed   By: Marijo Conception, M.D.   On: 11/08/2014 09:58   Mr Jodene Nam Head/brain Wo Cm  11/08/2014   CLINICAL DATA:  Found unresponsive 3 days ago. Concern for acute stroke. History of right breast cancer.  EXAM: MRI HEAD WITHOUT CONTRAST  MRA HEAD WITHOUT CONTRAST  TECHNIQUE: Multiplanar, multiecho pulse sequences of the brain and surrounding structures were obtained without intravenous contrast. Angiographic images of the head were obtained using MRA technique without contrast.  COMPARISON:  Head CT 11/08/2014  FINDINGS: MRI HEAD FINDINGS  There is cortically based, mildly restricted diffusion involving the posterior right insula and anterior right parietal lobe without definite corresponding T2 signal abnormality. There is a heterogeneously T2 hyperintense right posterior temporal intra-axial mass measuring 4.7 x 3.8 cm with heterogeneous areas of internal restricted diffusion. There is moderate surrounding vasogenic edema in the temporal, occipital, and parietal lobes with mass effect on the temporal and occipital horns of the right lateral ventricle. There is no midline shift.  There is mild-to-moderate cerebral atrophy, particularly in the left perisylvian region. No intracranial hemorrhage or extra-axial fluid collection is seen.  Prior bilateral cataract extraction is noted. Minimal posterior left ethmoid air cell mucosal thickening is present. Mastoid air cells are clear. Major intracranial vascular flow voids are preserved.  MRA HEAD FINDINGS  The study is mildly motion degraded.  Visualized distal vertebral arteries are patent with the left being slightly dominant. Evaluation of the proximal to mid V4 segments is  partly limited due to technical factors with decreased signal in this region. There is suggestion of a moderate to severe proximal left V4 stenosis. PICA origins are not clearly identified. Right AICA and bilateral SCA origins are patent. Basilar artery is patent without stenosis. PCA origins are patent. There is no significant proximal PCA stenosis, although there may be mild to moderate mid right P2 stenosis. The left P1 segment is patent without significant stenosis, however there is an apparent severe stenosis/ short segment occlusion of the proximal left P2 segment with reconstitution distally. Left PCA branch vessels distal the this are attenuated.  The intracranial right  ICA is patent with mild supraclinoid stenosis. Intracranial left ICA appears mildly small in caliber diffusely compared to the right which may be developmental as the left A1 segment appears to be either absent or severely hypoplastic. There is diminished flow related enhancement in the distal left carotid siphon which progressively decreases with only a very small amount of signal at the carotid terminus and into the left MCA, suggestive of a severe supraclinoid ICA stenosis. The left M1 segment is grossly patent although poorly evaluated given the decreased signal. There are multiple patent left M2 branches in the sylvian fissure. Right M1 and proximal M2 segments are patent without significant stenosis. There is mild distal right MCA branch vessel irregularity. Right A1 segment is patent without stenosis. Anterior communicating artery is patent. Right ACA supplies the left A2.  IMPRESSION: 1. 4.7 cm posterior right temporal lobe mass with moderate surrounding edema. Considerations include metastasis from patient's known breast cancer or primary CNS neoplasm. Further evaluation with post-contrast brain MRI is recommended. 2. Mildly restricted diffusion involving posterior right insula and anterior right parietal cortex, which could reflect  a small acute infarct or sequelae of recent seizure activity. 3. Mildly degraded head MRA. Decreased flow related enhancement in the distal left ICA and proximal left MCA concerning for high-grade, flow reducing proximal left supraclinoid ICA stenosis. 4. Suspected moderate to severe proximal left V4 vertebral artery stenosis. 5. Severe stenosis or short segment occlusion of the proximal left P2 segment.   Electronically Signed   By: Logan Bores   On: 11/08/2014 17:35    ASSESSMENT:  4.7 cm mass in the right temporal area with peripheral edema suggestive of either metastases or primary brain tumor. Patient certainly has a stage I breast cancer and likely to metastatic ties to the brain is very low Will start patient on now steroid Possibility of underlying seizure activity so will be started on Keppra Patient has multiple other medical problems like UTI sepsis Bipolar disease.  Previous history of sleep apnea, gout, thyroidism. 2.  We are awaiting for neurological evaluation by a neurologist regarding patient's history of carotid artery disease and multiple subacute infarct I discussed situation with Dr. Manuella Ghazi, her present attending in the hospital regarding either possibility of radiation therapy versus neurosurgical approach I will also discussed situation today with her son about overall expectation Palliative care physician also we will reevaluate patient I discussed also her care with palliative care physicians Dr. Megan Salon. After discussing with the family and radiation oncologist will formulate plan for the treatment Physiotherapy is in progress  Patient also has 4.5 cm mass in the breast which will be evaluated if needed  PLAN:  No problem-specific assessment & plan notes found for this encounter.   Patient expressed understanding and was in agreement with this plan. She also understands that She can call clinic at any time with any questions, concerns, or complaints.    No matching  staging information was found for the patient.  Forest Gleason, MD   11/09/2014 1:36 PM

## 2014-11-09 NOTE — Consult Note (Signed)
[facility] Infectious Disease     Reason for Consult: Bacteremia    Referring Physician: Demetrios Loll Date of Admission:  11/08/2014   Principal Problem:   Acute CVA (cerebrovascular accident) Active Problems:   UTI (lower urinary tract infection)   HPI: Allison Gonzalez is a 81 y.o. female recently admitted 8/8-8/11 with AMS> [facility] was found to have a Proteus UTI and was treated appropriately. She was readmitted 8/14 with worsening MS and poor po intake. Found to have a brain mass and a 4.5 cm Breast mass. She is also bacteremic with GPC.  She has been afebrile but has wbc of 15. UA this admission and last had TNTC WBC.  Past Medical History  Diagnosis Date  . Breast cancer 2015    positive w/ radiation  . Hypertension   . Depression   . Anxiety   . Sleep apnea   . Hypothyroidism   . Gout of right foot 10/21/14  . Gout of right foot   . Hypercholesterolemia 10/21/14  . Diabetes mellitus without complication    Past Surgical History  Procedure Laterality Date  . Eye surgery    . Breast surgery    . Hernia repair Right 10/21/14   Social History  Substance Use Topics  . Smoking status: Former Smoker    Types: Cigarettes    Quit date: 10/21/1974  . Smokeless tobacco: None  . Alcohol Use: No   Family History  Problem Relation Age of Onset  . Hypertension Son   . CVA Mother   . Heart disease Father     Allergies:  Allergies  Allergen Reactions  . Penicillins Rash    Current antibiotics: Antibiotics Given (last 72 hours)    Date/Time Action Medication Dose Rate   11/09/14 1025 Given   vancomycin (VANCOCIN) 1,250 mg in sodium chloride 0.9 % 250 mL IVPB 1,250 mg 166.7 mL/hr   11/09/14 1227 Given   levofloxacin (LEVAQUIN) IVPB 750 mg 750 mg 100 mL/hr   11/09/14 1929 Given   vancomycin (VANCOCIN) 1,250 mg in sodium chloride 0.9 % 250 mL IVPB 1,250 mg 166.7 mL/hr      MEDICATIONS: . allopurinol  300 mg Oral Daily  . antiseptic oral rinse  7 mL Mouth Rinse BID  .  aspirin  300 mg Rectal Daily  . atorvastatin  10 mg Oral q1800  . [START ON 11/10/2014] cholecalciferol  2,000 Units Oral Daily  . dexamethasone  10 mg Intravenous Q12H  . FLUoxetine  40 mg Oral Daily  . heparin  5,000 Units Subcutaneous 3 times per day  . levETIRAcetam  750 mg Oral BID  . levofloxacin (LEVAQUIN) IV  750 mg Intravenous Q24H  . [START ON 11/10/2014] levothyroxine  200 mcg Oral QAC breakfast  . [START ON 11/10/2014] losartan  25 mg Oral Daily  . [START ON 11/10/2014] multivitamin with minerals  1 tablet Oral Daily  . pantoprazole  40 mg Oral QHS  . vancomycin  1,250 mg Intravenous Q18H    Review of Systems - 11 systems reviewed and negative per HPI   OBJECTIVE: Temp:  [97.8 F (36.6 C)-99.1 F (37.3 C)] 97.8 F (36.6 C) (08/15 2045) Pulse Rate:  [83-98] 86 (08/15 2045) Resp:  [16-24] 20 (08/15 2045) BP: (108-142)/(47-76) 141/58 mmHg (08/15 2045) SpO2:  [95 %-99 %] 98 % (08/15 2045) Physical Exam  Constitutional:  Obese, lying in bed, confused. But awake HENT: [place]/AT, PERRLA, no scleral icterus Mouth/Throat: Oropharynx is clear and dry . No oropharyngeal exudate.  Cardiovascular: Normal rate, regular rhythm and normal heart sounds. 1/6 sm Pulmonary/Chest: Effort normal and breath sounds normal. No respiratory distress.  has no wheezes.  Neck  supple, no nuchal rigidity Abdominal: Soft. Bowel sounds are normal.  exhibits no distension. There is no tenderness.  Lymphadenopathy: no cervical adenopathy. No axillary adenopathy Neurological: . Alert but somewhat confused, moves arms legs Skin: Skin is warm and dry. No rash noted. No erythema.  Psychiatric: flat affect  LABS: Results for orders placed or performed during the hospital encounter of 11/08/14 (from the past 48 hour(s))  Lactic acid, plasma     Status: None   Collection Time: 11/08/14  8:54 AM  Result Value Ref Range   Lactic Acid, Venous 1.3 0.5 - 2.0 mmol/L  Comprehensive metabolic panel     Status:  Abnormal   Collection Time: 11/08/14  8:54 AM  Result Value Ref Range   Sodium 139 135 - 145 mmol/L   Potassium 4.6 3.5 - 5.1 mmol/L   Chloride 102 101 - 111 mmol/L   CO2 26 22 - 32 mmol/L   Glucose, Bld 104 (H) 65 - 99 mg/dL   BUN 19 6 - 20 mg/dL   Creatinine, Ser 0.95 0.44 - 1.00 mg/dL   Calcium 10.0 8.9 - 10.3 mg/dL   Total Protein 7.6 6.5 - 8.1 g/dL   Albumin 3.6 3.5 - 5.0 g/dL   AST 38 15 - 41 U/L   ALT 23 14 - 54 U/L   Alkaline Phosphatase 122 38 - 126 U/L   Total Bilirubin 0.4 0.3 - 1.2 mg/dL   GFR calc non Af Amer 54 (L) >60 mL/min   GFR calc Af Amer >60 >60 mL/min    Comment: (NOTE) The eGFR has been calculated using the CKD EPI equation. This calculation has not been validated in all clinical situations. eGFR's persistently <60 mL/min signify possible Chronic Kidney Disease.    Anion gap 11 5 - 15  Lipase, blood     Status: Abnormal   Collection Time: 11/08/14  8:54 AM  Result Value Ref Range   Lipase 14 (L) 22 - 51 U/L  Troponin I     Status: None   Collection Time: 11/08/14  8:54 AM  Result Value Ref Range   Troponin I <0.03 <0.031 ng/mL    Comment:        NO INDICATION OF MYOCARDIAL INJURY.   CBC WITH DIFFERENTIAL     Status: Abnormal   Collection Time: 11/08/14  8:54 AM  Result Value Ref Range   WBC 15.5 (H) 3.6 - 11.0 K/uL   RBC 5.03 3.80 - 5.20 MIL/uL   Hemoglobin 14.5 12.0 - 16.0 g/dL   HCT 45.5 35.0 - 47.0 %   MCV 90.5 80.0 - 100.0 fL   MCH 28.9 26.0 - 34.0 pg   MCHC 31.9 (L) 32.0 - 36.0 g/dL   RDW 15.7 (H) 11.5 - 14.5 %   Platelets 269 150 - 440 K/uL   Neutrophils Relative % 68 %   Neutro Abs 10.6 (H) 1.4 - 6.5 K/uL   Lymphocytes Relative 24 %   Lymphs Abs 3.6 1.0 - 3.6 K/uL   Monocytes Relative 6 %   Monocytes Absolute 0.9 0.2 - 0.9 K/uL   Eosinophils Relative 1 %   Eosinophils Absolute 0.1 0 - 0.7 K/uL   Basophils Relative 1 %   Basophils Absolute 0.1 0 - 0.1 K/uL  APTT     Status: None   Collection Time: 11/08/14  8:54 AM  Result  Value Ref Range   aPTT 29 24 - 36 seconds  Protime-INR     Status: None   Collection Time: 11/08/14  8:54 AM  Result Value Ref Range   Prothrombin Time 14.2 11.4 - 15.0 seconds   INR 1.08   Blood Culture (routine x 2)     Status: None (Preliminary result)   Collection Time: 11/08/14  8:55 AM  Result Value Ref Range   Specimen Description BLOOD RIGHT ARM    Special Requests      BOTTLES DRAWN AEROBIC AND ANAEROBIC  AER 5CC ANA 2CC   Culture  Setup Time      GRAM POSITIVE COCCI IN CLUSTERS IN BOTH AEROBIC AND ANAEROBIC BOTTLES CRITICAL RESULT CALLED TO, READ BACK BY AND VERIFIED WITH: ERIN PARDINI AT 1761 11/09/14 CTJ CRITICAL VALUE NOTED.  VALUE IS CONSISTENT WITH PREVIOUSLY REPORTED AND CALLED VALUE.    Culture      GRAM POSITIVE COCCI IN CLUSTERS IN BOTH AEROBIC AND ANAEROBIC BOTTLES IDENTIFICATION TO FOLLOW    Report Status PENDING   Urinalysis complete, with microscopic ([place] only)     Status: Abnormal   Collection Time: 11/08/14  8:56 AM  Result Value Ref Range   Color, Urine AMBER (A) YELLOW   APPearance CLOUDY (A) CLEAR   Glucose, UA NEGATIVE NEGATIVE mg/dL   Bilirubin Urine NEGATIVE NEGATIVE   Ketones, ur TRACE (A) NEGATIVE mg/dL   Specific Gravity, Urine 1.025 1.005 - 1.030   Hgb urine dipstick 3+ (A) NEGATIVE   pH 5.0 5.0 - 8.0   Protein, ur 30 (A) NEGATIVE mg/dL   Nitrite NEGATIVE NEGATIVE   Leukocytes, UA 2+ (A) NEGATIVE   RBC / HPF TOO NUMEROUS TO COUNT 0 - 5 RBC/hpf   WBC, UA TOO NUMEROUS TO COUNT 0 - 5 WBC/hpf   Bacteria, UA RARE (A) NONE SEEN   Squamous Epithelial / LPF 0-5 (A) NONE SEEN   WBC Clumps PRESENT    Mucous PRESENT   Urine culture     Status: None   Collection Time: 11/08/14  8:56 AM  Result Value Ref Range   Specimen Description URINE, RANDOM    Special Requests Normal    Culture MULTIPLE SPECIES PRESENT, SUGGEST RECOLLECTION    Report Status 11/09/2014 FINAL   Blood Culture (routine x 2)     Status: None (Preliminary result)    Collection Time: 11/08/14  9:00 AM  Result Value Ref Range   Specimen Description BLOOD LEFT FATTY CASTS    Special Requests      BOTTLES DRAWN AEROBIC AND ANAEROBIC  AER 4CC ANA 1CC   Culture  Setup Time      GRAM POSITIVE COCCI IN CLUSTERS AEROBIC BOTTLE ONLY CRITICAL RESULT CALLED TO, READ BACK BY AND VERIFIED WITH: ERIN PARDINI AT 0820 11/09/14 CTJ    Culture      GRAM POSITIVE COCCI IN CLUSTERS AEROBIC BOTTLE ONLY IDENTIFICATION TO FOLLOW    Report Status PENDING   Lactic acid, plasma     Status: None   Collection Time: 11/08/14  3:15 PM  Result Value Ref Range   Lactic Acid, Venous 1.1 0.5 - 2.0 mmol/L  Hemoglobin A1c     Status: None   Collection Time: 11/09/14  4:51 AM  Result Value Ref Range   Hgb A1c MFr Bld 5.3 4.0 - 6.0 %  Lipid panel     Status: Abnormal   Collection Time: 11/09/14  4:51 AM  Result Value Ref  Range   Cholesterol 119 0 - 200 mg/dL   Triglycerides 119 <150 mg/dL   HDL 33 (L) >40 mg/dL   Total CHOL/HDL Ratio 3.6 RATIO   VLDL 24 0 - 40 mg/dL   LDL Cholesterol 62 0 - 99 mg/dL    Comment:        Total Cholesterol/HDL:CHD Risk Coronary Heart Disease Risk Table                     Men   Women  1/2 Average Risk   3.4   3.3  Average Risk       5.0   4.4  2 X Average Risk   9.6   7.1  3 X Average Risk  23.4   11.0        Use the calculated Patient Ratio above and the CHD Risk Table to determine the patient's CHD Risk.        ATP III CLASSIFICATION (LDL):  <100     mg/dL   Optimal  100-129  mg/dL   Near or Above                    Optimal  130-159  mg/dL   Borderline  160-189  mg/dL   High  >190     mg/dL   Very High    No components found for: ESR, C REACTIVE PROTEIN MICRO: Recent Results (from the past 720 hour(s))  Blood culture (routine x 2)     Status: None   Collection Time: 11/02/14  6:21 PM  Result Value Ref Range Status   Specimen Description BLOOD RIGHT HAND  Final   Special Requests BAA,5ML,ANA,AER  Final   Culture NO GROWTH 7  DAYS  Final   Report Status 11/09/2014 FINAL  Final  Blood culture (routine x 2)     Status: None   Collection Time: 11/02/14  6:21 PM  Result Value Ref Range Status   Specimen Description BLOOD LEFT ARM  Final   Special Requests BAA,10ML,ANA,AER  Final   Culture NO GROWTH 7 DAYS  Final   Report Status 11/09/2014 FINAL  Final  Urine culture     Status: None   Collection Time: 11/02/14  6:21 PM  Result Value Ref Range Status   Specimen Description URINE, RANDOM  Final   Special Requests Normal  Final   Culture   Final    >=100,000 COLONIES/mL PROTEUS MIRABILIS >=100,000 COLONIES/mL ESCHERICHIA COLI    Report Status 11/06/2014 FINAL  Final   Organism ID, Bacteria PROTEUS MIRABILIS  Final   Organism ID, Bacteria ESCHERICHIA COLI  Final      Susceptibility   Escherichia coli - MIC*    AMPICILLIN 4 SENSITIVE Sensitive     CEFTAZIDIME <=1 SENSITIVE Sensitive     CEFAZOLIN <=4 SENSITIVE Sensitive     CEFTRIAXONE <=1 SENSITIVE Sensitive     CIPROFLOXACIN <=0.25 SENSITIVE Sensitive     GENTAMICIN <=1 SENSITIVE Sensitive     IMIPENEM <=0.25 SENSITIVE Sensitive     TRIMETH/SULFA <=20 SENSITIVE Sensitive     NITROFURANTOIN Value in next row Sensitive      SENSITIVE<=16    PIP/TAZO Value in next row Sensitive      SENSITIVE<=4    LEVOFLOXACIN Value in next row Sensitive      SENSITIVE<=0.12    * >=100,000 COLONIES/mL ESCHERICHIA COLI   Proteus mirabilis - MIC*    AMPICILLIN Value in next row Sensitive  SENSITIVE<=0.12    CEFTAZIDIME Value in next row Sensitive      SENSITIVE<=0.12    CEFAZOLIN Value in next row Sensitive      SENSITIVE<=0.12    CEFTRIAXONE Value in next row Sensitive      SENSITIVE<=0.12    CIPROFLOXACIN Value in next row Sensitive      SENSITIVE<=0.12    GENTAMICIN Value in next row Sensitive      SENSITIVE<=0.12    IMIPENEM Value in next row Sensitive      SENSITIVE<=0.12    TRIMETH/SULFA Value in next row Sensitive      SENSITIVE<=0.12     NITROFURANTOIN Value in next row Resistant      RESISTANT128    PIP/TAZO Value in next row Sensitive      SENSITIVE<=4    AMPICILLIN/SULBACTAM Value in next row Sensitive      SENSITIVE<=2    * >=100,000 COLONIES/mL PROTEUS MIRABILIS  Blood Culture (routine x 2)     Status: None (Preliminary result)   Collection Time: 11/08/14  8:55 AM  Result Value Ref Range Status   Specimen Description BLOOD RIGHT ARM  Final   Special Requests   Final    BOTTLES DRAWN AEROBIC AND ANAEROBIC  AER 5CC ANA 2CC   Culture  Setup Time   Final    GRAM POSITIVE COCCI IN CLUSTERS IN BOTH AEROBIC AND ANAEROBIC BOTTLES CRITICAL RESULT CALLED TO, READ BACK BY AND VERIFIED WITH: ERIN PARDINI AT [place] 11/09/14 CTJ CRITICAL VALUE NOTED.  VALUE IS CONSISTENT WITH PREVIOUSLY REPORTED AND CALLED VALUE.    Culture   Final    GRAM POSITIVE COCCI IN CLUSTERS IN BOTH AEROBIC AND ANAEROBIC BOTTLES IDENTIFICATION TO FOLLOW    Report Status PENDING  Incomplete  Urine culture     Status: None   Collection Time: 11/08/14  8:56 AM  Result Value Ref Range Status   Specimen Description URINE, RANDOM  Final   Special Requests Normal  Final   Culture MULTIPLE SPECIES PRESENT, SUGGEST RECOLLECTION  Final   Report Status 11/09/2014 FINAL  Final  Blood Culture (routine x 2)     Status: None (Preliminary result)   Collection Time: 11/08/14  9:00 AM  Result Value Ref Range Status   Specimen Description BLOOD LEFT FATTY CASTS  Final   Special Requests   Final    BOTTLES DRAWN AEROBIC AND ANAEROBIC  AER 4CC ANA 1CC   Culture  Setup Time   Final    GRAM POSITIVE COCCI IN CLUSTERS AEROBIC BOTTLE ONLY CRITICAL RESULT CALLED TO, READ BACK BY AND VERIFIED WITH: ERIN PARDINI AT 0820 11/09/14 CTJ    Culture   Final    GRAM POSITIVE COCCI IN CLUSTERS AEROBIC BOTTLE ONLY IDENTIFICATION TO FOLLOW    Report Status PENDING  Incomplete    IMAGING: Dg Chest 2 View  11/02/2014   CLINICAL DATA:  Dizziness, evaluate for pneumonia  EXAM:  CHEST  2 VIEW  COMPARISON:  02/04/2013  FINDINGS: There is no focal parenchymal opacity. There is no pleural effusion or pneumothorax. There is mild stable cardiomegaly.  There is bilateral glenohumeral osteoarthritis.  IMPRESSION: No active cardiopulmonary disease.   Electronically Signed   By: Kathreen Devoid   On: 11/02/2014 17:49   Ct Head Wo Contrast  11/08/2014   CLINICAL DATA:  Head injury and loss of consciousness after fall.  EXAM: CT HEAD WITHOUT CONTRAST  CT CERVICAL SPINE WITHOUT CONTRAST  TECHNIQUE: Multidetector CT imaging of the head and cervical  spine was performed following the standard protocol without intravenous contrast. Multiplanar CT image reconstructions of the cervical spine were also generated.  COMPARISON:  None.  FINDINGS: CT HEAD FINDINGS  Bony calvarium appears intact. Sulcal effacement and large area of low density is seen involving the right posterior parietal cortex concerning for acute infarction. No midline shift is noted. Ventricular size is within normal limits. No hemorrhage is noted.  CT CERVICAL SPINE FINDINGS  Reversal of normal lordosis of cervical spine is noted, most likely due to severe degenerative disc disease present at C4-5, C5-6 and C6-7. Moderate degenerative disc disease is also present at C3-4. No fracture or spondylolisthesis is noted. Mild bilateral posterior facet joint hypertrophy is noted in the upper cervical spine. Visualized lung apices appear normal.  IMPRESSION: Probable large area of acute infarction involving the right posterior parietal cortex. MRI may be performed for further evaluation.  Severe multilevel degenerative disc disease is noted in the cervical spine. No fracture or spondylolisthesis is noted.   Electronically Signed   By: Marijo Conception, M.D.   On: 11/08/2014 10:32   Ct Cervical Spine Wo Contrast  11/08/2014   CLINICAL DATA:  Head injury and loss of consciousness after fall.  EXAM: CT HEAD WITHOUT CONTRAST  CT CERVICAL SPINE WITHOUT  CONTRAST  TECHNIQUE: Multidetector CT imaging of the head and cervical spine was performed following the standard protocol without intravenous contrast. Multiplanar CT image reconstructions of the cervical spine were also generated.  COMPARISON:  None.  FINDINGS: CT HEAD FINDINGS  Bony calvarium appears intact. Sulcal effacement and large area of low density is seen involving the right posterior parietal cortex concerning for acute infarction. No midline shift is noted. Ventricular size is within normal limits. No hemorrhage is noted.  CT CERVICAL SPINE FINDINGS  Reversal of normal lordosis of cervical spine is noted, most likely due to severe degenerative disc disease present at C4-5, C5-6 and C6-7. Moderate degenerative disc disease is also present at C3-4. No fracture or spondylolisthesis is noted. Mild bilateral posterior facet joint hypertrophy is noted in the upper cervical spine. Visualized lung apices appear normal.  IMPRESSION: Probable large area of acute infarction involving the right posterior parietal cortex. MRI may be performed for further evaluation.  Severe multilevel degenerative disc disease is noted in the cervical spine. No fracture or spondylolisthesis is noted.   Electronically Signed   By: Marijo Conception, M.D.   On: 11/08/2014 10:32   Mr Brain Wo Contrast  11/08/2014   CLINICAL DATA:  Found unresponsive 3 days ago. Concern for acute stroke. History of right breast cancer.  EXAM: MRI HEAD WITHOUT CONTRAST  MRA HEAD WITHOUT CONTRAST  TECHNIQUE: Multiplanar, multiecho pulse sequences of the brain and surrounding structures were obtained without intravenous contrast. Angiographic images of the head were obtained using MRA technique without contrast.  COMPARISON:  Head CT 11/08/2014  FINDINGS: MRI HEAD FINDINGS  There is cortically based, mildly restricted diffusion involving the posterior right insula and anterior right parietal lobe without definite corresponding T2 signal abnormality. There  is a heterogeneously T2 hyperintense right posterior temporal intra-axial mass measuring 4.7 x 3.8 cm with heterogeneous areas of internal restricted diffusion. There is moderate surrounding vasogenic edema in the temporal, occipital, and parietal lobes with mass effect on the temporal and occipital horns of the right lateral ventricle. There is no midline shift.  There is mild-to-moderate cerebral atrophy, particularly in the left perisylvian region. No intracranial hemorrhage or extra-axial fluid collection is seen.  Prior bilateral cataract extraction is noted. Minimal posterior left ethmoid air cell mucosal thickening is present. Mastoid air cells are clear. Major intracranial vascular flow voids are preserved.  MRA HEAD FINDINGS  The study is mildly motion degraded.  Visualized distal vertebral arteries are patent with the left being slightly dominant. Evaluation of the proximal to mid V4 segments is partly limited due to technical factors with decreased signal in this region. There is suggestion of a moderate to severe proximal left V4 stenosis. PICA origins are not clearly identified. Right AICA and bilateral SCA origins are patent. Basilar artery is patent without stenosis. PCA origins are patent. There is no significant proximal PCA stenosis, although there may be mild to moderate mid right P2 stenosis. The left P1 segment is patent without significant stenosis, however there is an apparent severe stenosis/ short segment occlusion of the proximal left P2 segment with reconstitution distally. Left PCA branch vessels distal the this are attenuated.  The intracranial right ICA is patent with mild supraclinoid stenosis. Intracranial left ICA appears mildly small in caliber diffusely compared to the right which may be developmental as the left A1 segment appears to be either absent or severely hypoplastic. There is diminished flow related enhancement in the distal left carotid siphon which progressively  decreases with only a very small amount of signal at the carotid terminus and into the left MCA, suggestive of a severe supraclinoid ICA stenosis. The left M1 segment is grossly patent although poorly evaluated given the decreased signal. There are multiple patent left M2 branches in the sylvian fissure. Right M1 and proximal M2 segments are patent without significant stenosis. There is mild distal right MCA branch vessel irregularity. Right A1 segment is patent without stenosis. Anterior communicating artery is patent. Right ACA supplies the left A2.  IMPRESSION: 1. 4.7 cm posterior right temporal lobe mass with moderate surrounding edema. Considerations include metastasis from patient's known breast cancer or primary CNS neoplasm. Further evaluation with post-contrast brain MRI is recommended. 2. Mildly restricted diffusion involving posterior right insula and anterior right parietal cortex, which could reflect a small acute infarct or sequelae of recent seizure activity. 3. Mildly degraded head MRA. Decreased flow related enhancement in the distal left ICA and proximal left MCA concerning for high-grade, flow reducing proximal left supraclinoid ICA stenosis. 4. Suspected moderate to severe proximal left V4 vertebral artery stenosis. 5. Severe stenosis or short segment occlusion of the proximal left P2 segment.   Electronically Signed   By: Logan Bores   On: 11/08/2014 17:35   Ct Abdomen Pelvis W Contrast  11/08/2014   CLINICAL DATA:  81 year old female found unresponsive. Sepsis. Recent urinary tract infection. Initial encounter.  EXAM: CT ABDOMEN AND PELVIS WITH CONTRAST  TECHNIQUE: Multidetector CT imaging of the abdomen and pelvis was performed using the standard protocol following bolus administration of intravenous contrast.  CONTRAST:  180mL OMNIPAQUE IOHEXOL 350 MG/ML SOLN  COMPARISON:  Lumbar MRI 02/01/2013.  FINDINGS: Cardiomegaly. No pericardial effusion. Bilateral lung base scarring or atelectasis.  No pleural effusion or lung base consolidation.  Advanced degenerative changes in the spine. Osteopenia. No acute osseous abnormality identified.  No pelvic free fluid. Negative uterus and adnexa. Negative rectum aside from mild retained stool.  The bladder is mildly distended and there is perivesical stranding (series 2, image 76). There is a 2-3 cm diverticulum at the bladder dome (series 2, image 69 and sagittal image 87). Negative distal left ureters. No gas within the bladder.  Large body habitus  and laxity of the ventral abdominal wall. Redundant sigmoid colon. Proximal sigmoid diverticulosis but no active inflammation. Negative left colon. Negative transverse colon. Redundant right colon. The entire right lateral abdomen wall is not included due to large body habitus. No large bowel inflammation identified. Negative terminal ileum. Appendix not identified. No dilated small bowel. No ventral abdominal hernia identified. Postoperative changes to the right ventral abdominal wall. Negative stomach.  Liver, spleen, pancreas (atrophied), and adrenal glands are within normal limits. The gallbladder appears to be surgically absent. The portal venous system is patent. Extensive Aortoiliac calcified atherosclerosis noted. Major arterial structures are patent. No abdominal free fluid. No lymphadenopathy. Nonobstructing right midpole 11 mm calculus. Otherwise normal for age kidneys. Renal contrast enhancement and excretion is symmetric.  There is a 4.5 cm round and spiculated intermediate density (40 Hounsfield units) right breast mass. See coronal image 83.  IMPRESSION: 1. Bladder wall inflammation compatible with urinary infection. 2-3 cm bladder fundus diverticulum. Kidneys are negative aside from nonobstructing right nephrolithiasis. 2. 4.5 cm right breast mass. Recommend mammographic correlation once the patient recovers from the acute illness. 3. No other acute or inflammatory process in the abdomen or pelvis. 4.  Extensive calcified aortic atherosclerosis. Extensive spine degeneration.   Electronically Signed   By: Genevie Ann M.D.   On: 11/08/2014 11:52   US Carotid Bilateral  11/08/2014   CLINICAL DATA:  Unresponsive, right temporal mass  EXAM: BILATERAL CAROTID DUPLEX ULTRASOUND  TECHNIQUE: Pearline Cables scale imaging, color Doppler and duplex ultrasound were performed of bilateral carotid and vertebral arteries in the neck. The examination is limited is a patient did not complete the left half of the study.  COMPARISON:  None.  FINDINGS: Criteria: Quantification of carotid stenosis is based on velocity parameters that correlate the residual internal carotid diameter with NASCET-based stenosis levels, using the diameter of the distal internal carotid lumen as the denominator for stenosis measurement.  The following velocity measurements were obtained:  RIGHT  ICA:  176/44 cm/sec  CCA:  62/13 cm/sec  SYSTOLIC ICA/CCA RATIO:  3.2  DIASTOLIC ICA/CCA RATIO:  2.9  ECA:  248 cm/sec  LEFT  Not completed at the patient's wishes  RIGHT CAROTID ARTERY: Diffuse atherosclerotic plaque is noted in the region of the carotid bulb and proximal internal carotid artery. The waveforms, velocities and flow velocity ratios suggest a stenosis in the 50-69% range.  RIGHT VERTEBRAL ARTERY:  Antegrade in nature.  IMPRESSION: 50-69% stenosis in the right proximal internal carotid artery.  Patient refused further imaging and the left carotid artery was not interrogated.   Electronically Signed   By: Inez Catalina M.D.   On: 11/08/2014 18:41   Dg Chest Port 1 View  11/08/2014   CLINICAL DATA:  Sepsis.  EXAM: PORTABLE CHEST - 1 VIEW  COMPARISON:  November 02, 2014.  FINDINGS: Stable cardiomegaly. No pneumothorax or significant pleural effusion is noted. Right lung is clear. Mild left basilar opacity is noted concerning for subsegmental atelectasis or scarring. Severe degenerative changes seen involving the glenohumeral joints bilaterally.  IMPRESSION: Mild left  basilar subsegmental atelectasis or scarring. Stable cardiomegaly.   Electronically Signed   By: Marijo Conception, M.D.   On: 11/08/2014 09:58   Mr Jodene Nam Head/brain Wo Cm  11/08/2014   CLINICAL DATA:  Found unresponsive 3 days ago. Concern for acute stroke. History of right breast cancer.  EXAM: MRI HEAD WITHOUT CONTRAST  MRA HEAD WITHOUT CONTRAST  TECHNIQUE: Multiplanar, multiecho pulse sequences of the brain and surrounding  structures were obtained without intravenous contrast. Angiographic images of the head were obtained using MRA technique without contrast.  COMPARISON:  Head CT 11/08/2014  FINDINGS: MRI HEAD FINDINGS  There is cortically based, mildly restricted diffusion involving the posterior right insula and anterior right parietal lobe without definite corresponding T2 signal abnormality. There is a heterogeneously T2 hyperintense right posterior temporal intra-axial mass measuring 4.7 x 3.8 cm with heterogeneous areas of internal restricted diffusion. There is moderate surrounding vasogenic edema in the temporal, occipital, and parietal lobes with mass effect on the temporal and occipital horns of the right lateral ventricle. There is no midline shift.  There is mild-to-moderate cerebral atrophy, particularly in the left perisylvian region. No intracranial hemorrhage or extra-axial fluid collection is seen.  Prior bilateral cataract extraction is noted. Minimal posterior left ethmoid air cell mucosal thickening is present. Mastoid air cells are clear. Major intracranial vascular flow voids are preserved.  MRA HEAD FINDINGS  The study is mildly motion degraded.  Visualized distal vertebral arteries are patent with the left being slightly dominant. Evaluation of the proximal to mid V4 segments is partly limited due to technical factors with decreased signal in this region. There is suggestion of a moderate to severe proximal left V4 stenosis. PICA origins are not clearly identified. Right AICA and  bilateral SCA origins are patent. Basilar artery is patent without stenosis. PCA origins are patent. There is no significant proximal PCA stenosis, although there may be mild to moderate mid right P2 stenosis. The left P1 segment is patent without significant stenosis, however there is an apparent severe stenosis/ short segment occlusion of the proximal left P2 segment with reconstitution distally. Left PCA branch vessels distal the this are attenuated.  The intracranial right ICA is patent with mild supraclinoid stenosis. Intracranial left ICA appears mildly small in caliber diffusely compared to the right which may be developmental as the left A1 segment appears to be either absent or severely hypoplastic. There is diminished flow related enhancement in the distal left carotid siphon which progressively decreases with only a very small amount of signal at the carotid terminus and into the left MCA, suggestive of a severe supraclinoid ICA stenosis. The left M1 segment is grossly patent although poorly evaluated given the decreased signal. There are multiple patent left M2 branches in the sylvian fissure. Right M1 and proximal M2 segments are patent without significant stenosis. There is mild distal right MCA branch vessel irregularity. Right A1 segment is patent without stenosis. Anterior communicating artery is patent. Right ACA supplies the left A2.  IMPRESSION: 1. 4.7 cm posterior right temporal lobe mass with moderate surrounding edema. Considerations include metastasis from patient's known breast cancer or primary CNS neoplasm. Further evaluation with post-contrast brain MRI is recommended. 2. Mildly restricted diffusion involving posterior right insula and anterior right parietal cortex, which could reflect a small acute infarct or sequelae of recent seizure activity. 3. Mildly degraded head MRA. Decreased flow related enhancement in the distal left ICA and proximal left MCA concerning for high-grade, flow  reducing proximal left supraclinoid ICA stenosis. 4. Suspected moderate to severe proximal left V4 vertebral artery stenosis. 5. Severe stenosis or short segment occlusion of the proximal left P2 segment.   Electronically Signed   By: Logan Bores   On: 11/08/2014 17:35    Assessment:   Allison Gonzalez is a 81 y.o. female admitted with AMS and found to have recurrent UTI, GPC bacteremia, Brain mass anda breast mass.    Recommendations  Repeat bcx Continue vanco but change levo to ceftiaxone.  Echo (TTE) Neg Further recs based on culture results. Thank you very much for allowing me to participate in the care of this patient. Please call with questions.   Cheral Marker. Ola Spurr, MD

## 2014-11-09 NOTE — Plan of Care (Signed)
Problem: Discharge/Transitional Outcomes Goal: Ability to attain medications upon leaving hospital Outcome: Progressing Plan of care progress to goal: CVA: Pt receiving IVF. NIH score of 16 today. Worked with PT. Brain mass detected, onco consult completed. Positive blood cultures. On IV ABX. Family at the bedside. Vascular consult done. Palliative care consult ordered.

## 2014-11-09 NOTE — Plan of Care (Signed)
Problem: Discharge Progression Outcomes Goal: Discharge plan in place and appropriate Outcome: Progressing Individualization of Care Pt prefers to be called Allison Gonzalez Hx of breast CA, HTN, depression, anxiety, sleep apnea, hypotension, gout, hypercholesterolemia, fungal rash on buttocks     Goal: Other Discharge Outcomes/Goals 1.  Discharge Plan:  Admitted with telemetry and CVA protocol.  Neuro checks every 2 hours. 2.  Pain:  Patient on FLACC scale.  Patient is comfortable and without pain. 3.  Hemodynamically stable:  VSS this shift.  BP 136/53 mmHg  Pulse 83  Temp(Src) 98.6 F (37 C) (Oral)  Resp 18  Ht 5\' 8"  (1.727 m)  Wt 221 lb 5.5 oz (100.4 kg)  BMI 33.66 kg/m2  SpO2 95% 4.  Complications:  Patient complications improving gradually.  Patient was transported back to the floor at beginning of shift from Echo because she had a seizure during procedure.  She was unresponsive.  Patient has been gradually responding more.  She is now able to smile and speak in brief sentences.   5.  Activity:  Patient is on bedrest and has significant left side weakness.     Problem: Discharge/Transitional Outcomes Goal: Ability to attain medications upon leaving hospital 1.  Barriers: There are no barriers to progression at this time. 2.  Education:  Education plan completed and education provided to family - son Shanon Brow and son in [place]. 3.  Independent mobility:  Patient was unresponsive at beginning of shift.  She is gradually able to squeeze with her right hand and move her right foot.  She is not moving her left side at this time. 4.  Diet:  Patient is NPO at this time and needs swallow study/speech consult.  Patient is showing continual signs of improvement.  She is now opening her eyes to voice and smiling at caregivers.  She can nod her head and give brief answers to some questions.

## 2014-11-09 NOTE — Consult Note (Signed)
CC: R sided tumor.   HPI: Allison Gonzalez is an 81 y.o. female admitted with  seizure and suspected stroke.  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  She does have history of breast CA, but could not give me that information.   Past Medical History  Diagnosis Date  . Breast cancer 2015    positive w/ radiation  . Hypertension   . Depression   . Anxiety   . Sleep apnea   . Hypothyroidism   . Gout of right foot 10/21/14  . Gout of right foot   . Hypercholesterolemia 10/21/14  . Diabetes mellitus without complication     Past Surgical History  Procedure Laterality Date  . Eye surgery    . Breast surgery    . Hernia repair Right 10/21/14    Family History  Problem Relation Age of Onset  . Hypertension Son   . CVA Mother   . Heart disease Father     Social History:  reports that she quit smoking about 40 years ago. Her smoking use included Cigarettes. She does not have any smokeless tobacco history on file. She reports that she does not drink alcohol or use illicit drugs.  Allergies  Allergen Reactions  . Penicillins Rash    Medications: I have reviewed the patient's current medications.  ROS: Unable to obtain due to confusion   Physical Examination: Blood pressure 114/47, pulse 87, temperature 98.7 F (37.1 C), temperature source Oral, resp. rate 18, height 5\' 8"  (1.727 m), weight 100.4 kg (221 lb 5.5 oz), SpO2 97 %.  EOM intact Able to tell me name but not date or time or reason she is in hospital Motor generalized weakness without any sensory deficits  Coordination intact Gait not examined.    Laboratory Studies:   Basic Metabolic Panel:  Recent Labs Lab 11/03/14 0438 11/08/14 0854  NA 137 139  K 4.3 4.6  CL 105 102  CO2 24 26  GLUCOSE 79 104*  BUN 15 19  CREATININE 0.86 0.95  CALCIUM 9.4 10.0    Liver Function Tests:  Recent Labs Lab 11/08/14 0854  AST 38  ALT 23  ALKPHOS 122  BILITOT 0.4  PROT 7.6  ALBUMIN 3.6    Recent Labs Lab  11/08/14 0854  LIPASE 14*   No results for input(s): AMMONIA in the last 168 hours.  CBC:  Recent Labs Lab 11/03/14 0438 11/08/14 0854  WBC 12.4* 15.5*  NEUTROABS  --  10.6*  HGB 12.5 14.5  HCT 39.2 45.5  MCV 90.2 90.5  PLT 241 269    Cardiac Enzymes:  Recent Labs Lab 11/08/14 0854  TROPONINI <0.03    BNP: Invalid input(s): POCBNP  CBG:  Recent Labs Lab 11/04/14 0710  GLUCAP 38    Microbiology: Results for orders placed or performed during the hospital encounter of 11/08/14  Blood Culture (routine x 2)     Status: None (Preliminary result)   Collection Time: 11/08/14  8:55 AM  Result Value Ref Range Status   Specimen Description BLOOD RIGHT ARM  Final   Special Requests   Final    BOTTLES DRAWN AEROBIC AND ANAEROBIC  AER 5CC ANA 2CC   Culture  Setup Time   Final    GRAM POSITIVE COCCI IN CLUSTERS IN BOTH AEROBIC AND ANAEROBIC BOTTLES CRITICAL RESULT CALLED TO, READ BACK BY AND VERIFIED WITH: ERIN PARDINI AT 4765 11/09/14 [facility] CRITICAL VALUE NOTED.  VALUE IS CONSISTENT WITH PREVIOUSLY REPORTED AND CALLED  VALUE.    Culture   Final    GRAM POSITIVE COCCI IN CLUSTERS IN BOTH AEROBIC AND ANAEROBIC BOTTLES IDENTIFICATION TO FOLLOW    Report Status PENDING  Incomplete  Urine culture     Status: None   Collection Time: 11/08/14  8:56 AM  Result Value Ref Range Status   Specimen Description URINE, RANDOM  Final   Special Requests Normal  Final   Culture MULTIPLE SPECIES PRESENT, SUGGEST RECOLLECTION  Final   Report Status 11/09/2014 FINAL  Final  Blood Culture (routine x 2)     Status: None (Preliminary result)   Collection Time: 11/08/14  9:00 AM  Result Value Ref Range Status   Specimen Description BLOOD LEFT FATTY CASTS  Final   Special Requests   Final    BOTTLES DRAWN AEROBIC AND ANAEROBIC  AER 4CC ANA 1CC   Culture  Setup Time   Final    GRAM POSITIVE COCCI IN CLUSTERS AEROBIC BOTTLE ONLY CRITICAL RESULT CALLED TO, READ BACK BY AND VERIFIED WITH:  ERIN PARDINI AT 0820 11/09/14 CT[facility]    Culture   Final    GRAM POSITIVE COCCI IN CLUSTERS AEROBIC BOTTLE ONLY IDENTIFICATION TO FOLLOW    Report Status PENDING  Incomplete    Coagulation Studies:  Recent Labs  11/08/14 0854  LABPROT 14.2  INR 1.08    Urinalysis:  Recent Labs Lab 11/02/14 1821 11/08/14 0856  COLORURINE AMBER* AMBER*  LABSPEC 1.020 1.025  PHURINE 7.0 5.0  GLUCOSEU NEGATIVE NEGATIVE  HGBUR 1+* 3+*  BILIRUBINUR NEGATIVE NEGATIVE  KETONESUR TRACE* TRACE*  PROTEINUR 100* 30*  NITRITE POSITIVE* NEGATIVE  LEUKOCYTESUR 1+* 2+*    Lipid Panel:     Component Value Date/Time   CHOL 119 11/09/2014 0451   TRIG 119 11/09/2014 0451   HDL 33* 11/09/2014 0451   CHOLHDL 3.6 11/09/2014 0451   VLDL 24 11/09/2014 0451   LDLCALC 62 11/09/2014 0451    HgbA1C:  Lab Results  Component Value Date   HGBA1C 5.3 11/09/2014    Urine Drug Screen:     Component Value Date/Time   LABOPIA NEGATIVE 03/16/2012 1029   LABBENZ NEGATIVE 03/16/2012 1029   AMPHETMU NEGATIVE 03/16/2012 1029   THCU NEGATIVE 03/16/2012 1029   LABBARB NEGATIVE 03/16/2012 1029    Alcohol Level: No results for input(s): ETH in the last 168 hours.  Other results: EKG: normal EKG, normal sinus rhythm, unchanged from previous tracings.  Imaging: Ct Head Wo Contrast  11/08/2014   CLINICAL DATA:  Head injury and loss of consciousness after fall.  EXAM: CT HEAD WITHOUT CONTRAST  CT CERVICAL SPINE WITHOUT CONTRAST  TECHNIQUE: Multidetector CT imaging of the head and cervical spine was performed following the standard protocol without intravenous contrast. Multiplanar CT image reconstructions of the cervical spine were also generated.  COMPARISON:  None.  FINDINGS: CT HEAD FINDINGS  Bony calvarium appears intact. Sulcal effacement and large area of low density is seen involving the right posterior parietal cortex concerning for acute infarction. No midline shift is noted. Ventricular size is within normal  limits. No hemorrhage is noted.  CT CERVICAL SPINE FINDINGS  Reversal of normal lordosis of cervical spine is noted, most likely due to severe degenerative disc disease present at C4-5, C5-6 and C6-7. Moderate degenerative disc disease is also present at C3-4. No fracture or spondylolisthesis is noted. Mild bilateral posterior facet joint hypertrophy is noted in the upper cervical spine. Visualized lung apices appear normal.  IMPRESSION: Probable large area of acute  infarction involving the right posterior parietal cortex. MRI may be performed for further evaluation.  Severe multilevel degenerative disc disease is noted in the cervical spine. No fracture or spondylolisthesis is noted.   Electronically Signed   By: Marijo Conception, M.D.   On: 11/08/2014 10:32   Ct Cervical Spine Wo Contrast  11/08/2014   CLINICAL DATA:  Head injury and loss of consciousness after fall.  EXAM: CT HEAD WITHOUT CONTRAST  CT CERVICAL SPINE WITHOUT CONTRAST  TECHNIQUE: Multidetector CT imaging of the head and cervical spine was performed following the standard protocol without intravenous contrast. Multiplanar CT image reconstructions of the cervical spine were also generated.  COMPARISON:  None.  FINDINGS: CT HEAD FINDINGS  Bony calvarium appears intact. Sulcal effacement and large area of low density is seen involving the right posterior parietal cortex concerning for acute infarction. No midline shift is noted. Ventricular size is within normal limits. No hemorrhage is noted.  CT CERVICAL SPINE FINDINGS  Reversal of normal lordosis of cervical spine is noted, most likely due to severe degenerative disc disease present at C4-5, C5-6 and C6-7. Moderate degenerative disc disease is also present at C3-4. No fracture or spondylolisthesis is noted. Mild bilateral posterior facet joint hypertrophy is noted in the upper cervical spine. Visualized lung apices appear normal.  IMPRESSION: Probable large area of acute infarction involving the  right posterior parietal cortex. MRI may be performed for further evaluation.  Severe multilevel degenerative disc disease is noted in the cervical spine. No fracture or spondylolisthesis is noted.   Electronically Signed   By: Marijo Conception, M.D.   On: 11/08/2014 10:32   Mr Brain Wo Contrast  11/08/2014   CLINICAL DATA:  Found unresponsive 3 days ago. Concern for acute stroke. History of right breast cancer.  EXAM: MRI HEAD WITHOUT CONTRAST  MRA HEAD WITHOUT CONTRAST  TECHNIQUE: Multiplanar, multiecho pulse sequences of the brain and surrounding structures were obtained without intravenous contrast. Angiographic images of the head were obtained using MRA technique without contrast.  COMPARISON:  Head CT 11/08/2014  FINDINGS: MRI HEAD FINDINGS  There is cortically based, mildly restricted diffusion involving the posterior right insula and anterior right parietal lobe without definite corresponding T2 signal abnormality. There is a heterogeneously T2 hyperintense right posterior temporal intra-axial mass measuring 4.7 x 3.8 cm with heterogeneous areas of internal restricted diffusion. There is moderate surrounding vasogenic edema in the temporal, occipital, and parietal lobes with mass effect on the temporal and occipital horns of the right lateral ventricle. There is no midline shift.  There is mild-to-moderate cerebral atrophy, particularly in the left perisylvian region. No intracranial hemorrhage or extra-axial fluid collection is seen.  Prior bilateral cataract extraction is noted. Minimal posterior left ethmoid air cell mucosal thickening is present. Mastoid air cells are clear. Major intracranial vascular flow voids are preserved.  MRA HEAD FINDINGS  The study is mildly motion degraded.  Visualized distal vertebral arteries are patent with the left being slightly dominant. Evaluation of the proximal to mid V4 segments is partly limited due to technical factors with decreased signal in this region. There  is suggestion of a moderate to severe proximal left V4 stenosis. PICA origins are not clearly identified. Right AICA and bilateral SCA origins are patent. Basilar artery is patent without stenosis. PCA origins are patent. There is no significant proximal PCA stenosis, although there may be mild to moderate mid right P2 stenosis. The left P1 segment is patent without significant stenosis, however there  is an apparent severe stenosis/ short segment occlusion of the proximal left P2 segment with reconstitution distally. Left PCA branch vessels distal the this are attenuated.  The intracranial right ICA is patent with mild supraclinoid stenosis. Intracranial left ICA appears mildly small in caliber diffusely compared to the right which may be developmental as the left A1 segment appears to be either absent or severely hypoplastic. There is diminished flow related enhancement in the distal left carotid siphon which progressively decreases with only a very small amount of signal at the carotid terminus and into the left MCA, suggestive of a severe supraclinoid ICA stenosis. The left M1 segment is grossly patent although poorly evaluated given the decreased signal. There are multiple patent left M2 branches in the sylvian fissure. Right M1 and proximal M2 segments are patent without significant stenosis. There is mild distal right MCA branch vessel irregularity. Right A1 segment is patent without stenosis. Anterior communicating artery is patent. Right ACA supplies the left A2.  IMPRESSION: 1. 4.7 cm posterior right temporal lobe mass with moderate surrounding edema. Considerations include metastasis from patient's known breast cancer or primary CNS neoplasm. Further evaluation with post-contrast brain MRI is recommended. 2. Mildly restricted diffusion involving posterior right insula and anterior right parietal cortex, which could reflect a small acute infarct or sequelae of recent seizure activity. 3. Mildly degraded  head MRA. Decreased flow related enhancement in the distal left ICA and proximal left MCA concerning for high-grade, flow reducing proximal left supraclinoid ICA stenosis. 4. Suspected moderate to severe proximal left V4 vertebral artery stenosis. 5. Severe stenosis or short segment occlusion of the proximal left P2 segment.   Electronically Signed   By: Logan Bores   On: 11/08/2014 17:35   Ct Abdomen Pelvis W Contrast  11/08/2014   CLINICAL DATA:  81 year old female found unresponsive. Sepsis. Recent urinary tract infection. Initial encounter.  EXAM: CT ABDOMEN AND PELVIS WITH CONTRAST  TECHNIQUE: Multidetector CT imaging of the abdomen and pelvis was performed using the standard protocol following bolus administration of intravenous contrast.  CONTRAST:  164mL OMNIPAQUE IOHEXOL 350 MG/ML SOLN  COMPARISON:  Lumbar MRI 02/01/2013.  FINDINGS: Cardiomegaly. No pericardial effusion. Bilateral lung base scarring or atelectasis. No pleural effusion or lung base consolidation.  Advanced degenerative changes in the spine. Osteopenia. No acute osseous abnormality identified.  No pelvic free fluid. Negative uterus and adnexa. Negative rectum aside from mild retained stool.  The bladder is mildly distended and there is perivesical stranding (series 2, image 76). There is a 2-3 cm diverticulum at the bladder dome (series 2, image 69 and sagittal image 87). Negative distal left ureters. No gas within the bladder.  Large body habitus and laxity of the ventral abdominal wall. Redundant sigmoid colon. Proximal sigmoid diverticulosis but no active inflammation. Negative left colon. Negative transverse colon. Redundant right colon. The entire right lateral abdomen wall is not included due to large body habitus. No large bowel inflammation identified. Negative terminal ileum. Appendix not identified. No dilated small bowel. No ventral abdominal hernia identified. Postoperative changes to the right ventral abdominal wall. Negative  stomach.  Liver, spleen, pancreas (atrophied), and adrenal glands are within normal limits. The gallbladder appears to be surgically absent. The portal venous system is patent. Extensive Aortoiliac calcified atherosclerosis noted. Major arterial structures are patent. No abdominal free fluid. No lymphadenopathy. Nonobstructing right midpole 11 mm calculus. Otherwise normal for age kidneys. Renal contrast enhancement and excretion is symmetric.  There is a 4.5 cm round and spiculated  intermediate density (40 Hounsfield units) right breast mass. See coronal image 83.  IMPRESSION: 1. Bladder wall inflammation compatible with urinary infection. 2-3 cm bladder fundus diverticulum. Kidneys are negative aside from nonobstructing right nephrolithiasis. 2. 4.5 cm right breast mass. Recommend mammographic correlation once the patient recovers from the acute illness. 3. No other acute or inflammatory process in the abdomen or pelvis. 4. Extensive calcified aortic atherosclerosis. Extensive spine degeneration.   Electronically Signed   By: Genevie Ann M.D.   On: 11/08/2014 11:52   US Carotid Bilateral  11/08/2014   CLINICAL DATA:  Unresponsive, right temporal mass  EXAM: BILATERAL CAROTID DUPLEX ULTRASOUND  TECHNIQUE: Pearline Cables scale imaging, color Doppler and duplex ultrasound were performed of bilateral carotid and vertebral arteries in the neck. The examination is limited is a patient did not complete the left half of the study.  COMPARISON:  None.  FINDINGS: Criteria: Quantification of carotid stenosis is based on velocity parameters that correlate the residual internal carotid diameter with NASCET-based stenosis levels, using the diameter of the distal internal carotid lumen as the denominator for stenosis measurement.  The following velocity measurements were obtained:  RIGHT  ICA:  176/44 cm/sec  CCA:  81/44 cm/sec  SYSTOLIC ICA/CCA RATIO:  3.2  DIASTOLIC ICA/CCA RATIO:  2.9  ECA:  248 cm/sec  LEFT  Not completed at the  patient's wishes  RIGHT CAROTID ARTERY: Diffuse atherosclerotic plaque is noted in the region of the carotid bulb and proximal internal carotid artery. The waveforms, velocities and flow velocity ratios suggest a stenosis in the 50-69% range.  RIGHT VERTEBRAL ARTERY:  Antegrade in nature.  IMPRESSION: 50-69% stenosis in the right proximal internal carotid artery.  Patient refused further imaging and the left carotid artery was not interrogated.   Electronically Signed   By: Inez Catalina M.D.   On: 11/08/2014 18:41   Dg Chest Port 1 View  11/08/2014   CLINICAL DATA:  Sepsis.  EXAM: PORTABLE CHEST - 1 VIEW  COMPARISON:  November 02, 2014.  FINDINGS: Stable cardiomegaly. No pneumothorax or significant pleural effusion is noted. Right lung is clear. Mild left basilar opacity is noted concerning for subsegmental atelectasis or scarring. Severe degenerative changes seen involving the glenohumeral joints bilaterally.  IMPRESSION: Mild left basilar subsegmental atelectasis or scarring. Stable cardiomegaly.   Electronically Signed   By: Marijo Conception, M.D.   On: 11/08/2014 09:58   Mr Jodene Nam Head/brain Wo Cm  11/08/2014   CLINICAL DATA:  Found unresponsive 3 days ago. Concern for acute stroke. History of right breast cancer.  EXAM: MRI HEAD WITHOUT CONTRAST  MRA HEAD WITHOUT CONTRAST  TECHNIQUE: Multiplanar, multiecho pulse sequences of the brain and surrounding structures were obtained without intravenous contrast. Angiographic images of the head were obtained using MRA technique without contrast.  COMPARISON:  Head CT 11/08/2014  FINDINGS: MRI HEAD FINDINGS  There is cortically based, mildly restricted diffusion involving the posterior right insula and anterior right parietal lobe without definite corresponding T2 signal abnormality. There is a heterogeneously T2 hyperintense right posterior temporal intra-axial mass measuring 4.7 x 3.8 cm with heterogeneous areas of internal restricted diffusion. There is moderate  surrounding vasogenic edema in the temporal, occipital, and parietal lobes with mass effect on the temporal and occipital horns of the right lateral ventricle. There is no midline shift.  There is mild-to-moderate cerebral atrophy, particularly in the left perisylvian region. No intracranial hemorrhage or extra-axial fluid collection is seen.  Prior bilateral cataract extraction is noted. Minimal posterior  left ethmoid air cell mucosal thickening is present. Mastoid air cells are clear. Major intracranial vascular flow voids are preserved.  MRA HEAD FINDINGS  The study is mildly motion degraded.  Visualized distal vertebral arteries are patent with the left being slightly dominant. Evaluation of the proximal to mid V4 segments is partly limited due to technical factors with decreased signal in this region. There is suggestion of a moderate to severe proximal left V4 stenosis. PICA origins are not clearly identified. Right AICA and bilateral SCA origins are patent. Basilar artery is patent without stenosis. PCA origins are patent. There is no significant proximal PCA stenosis, although there may be mild to moderate mid right P2 stenosis. The left P1 segment is patent without significant stenosis, however there is an apparent severe stenosis/ short segment occlusion of the proximal left P2 segment with reconstitution distally. Left PCA branch vessels distal the this are attenuated.  The intracranial right ICA is patent with mild supraclinoid stenosis. Intracranial left ICA appears mildly small in caliber diffusely compared to the right which may be developmental as the left A1 segment appears to be either absent or severely hypoplastic. There is diminished flow related enhancement in the distal left carotid siphon which progressively decreases with only a very small amount of signal at the carotid terminus and into the left MCA, suggestive of a severe supraclinoid ICA stenosis. The left M1 segment is grossly patent  although poorly evaluated given the decreased signal. There are multiple patent left M2 branches in the sylvian fissure. Right M1 and proximal M2 segments are patent without significant stenosis. There is mild distal right MCA branch vessel irregularity. Right A1 segment is patent without stenosis. Anterior communicating artery is patent. Right ACA supplies the left A2.  IMPRESSION: 1. 4.7 cm posterior right temporal lobe mass with moderate surrounding edema. Considerations include metastasis from patient's known breast cancer or primary CNS neoplasm. Further evaluation with post-contrast brain MRI is recommended. 2. Mildly restricted diffusion involving posterior right insula and anterior right parietal cortex, which could reflect a small acute infarct or sequelae of recent seizure activity. 3. Mildly degraded head MRA. Decreased flow related enhancement in the distal left ICA and proximal left MCA concerning for high-grade, flow reducing proximal left supraclinoid ICA stenosis. 4. Suspected moderate to severe proximal left V4 vertebral artery stenosis. 5. Severe stenosis or short segment occlusion of the proximal left P2 segment.   Electronically Signed   By: Logan Bores   On: 11/08/2014 17:35     Assessment/Plan:  81 y.o. female admitted with  seizure and suspected stroke.  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  She does have history of breast CA, but could not give me that information.  - Would obtain MRI brain with contrast this time looking for enhancement.  - CT chest/abd/pelvis.  - con't Keppra  - would be cautious with steroids as if this is a primary lymphoma, it can be masked as normal tissue - Don't think she would be a great surgical candidate, but would further discuss with family after MRI with contrast.  Leotis Pain   11/09/2014, 2:53 PM

## 2014-11-09 NOTE — Consult Note (Signed)
[facility] Vascular Consult Note  MRN : [id number]  Allison Gonzalez is a 81 y.o. (27-Dec-1929) female who presents with chief complaint of  Chief Complaint  Patient presents with  . Recurrent UTI  .  History of Present Illness: Patient admitted yesterday after recent admission.  Had seizure and felt to have a stroke.  Has confusion and difficult to discern speech at this point.  Also had UTI.  Has had extensive radiology studies which I have reviewed.  Has moderate cervical right ICA stenosis.  She would not allow her left carotid to be checked apparently.  Has intracranial vascular disease as well.  Biggest finding is of a massive right sided lesion consistent with metastatic disease.    Current Facility-Administered Medications  Medication Dose Route Frequency Provider Last Rate Last Dose  . 0.9 %  sodium chloride infusion   Intravenous Continuous Demetrios Loll, MD 50 mL/hr at 11/08/14 1934    . aspirin suppository 300 mg  300 mg Rectal Daily Demetrios Loll, MD   300 mg at 11/09/14 0854  . dexamethasone (DECADRON) injection 10 mg  10 mg Intravenous Q12H Forest Gleason, MD      . heparin injection 5,000 Units  5,000 Units Subcutaneous 3 times per day Demetrios Loll, MD   5,000 Units at 11/09/14 1337  . levETIRAcetam (KEPPRA) tablet 750 mg  750 mg Oral BID Forest Gleason, MD      . levofloxacin (LEVAQUIN) IVPB 750 mg  750 mg Intravenous Q24H Hinda Kehr, MD   750 mg at 11/09/14 1227  . senna-docusate (Senokot-S) tablet 1 tablet  1 tablet Oral QHS PRN Demetrios Loll, MD      . vancomycin (VANCOCIN) 1,250 mg in sodium chloride 0.9 % 250 mL IVPB  1,250 mg Intravenous Q18H Max Sane, MD        Past Medical History  Diagnosis Date  . Breast cancer 2015    positive w/ radiation  . Hypertension   . Depression   . Anxiety   . Sleep apnea   . Hypothyroidism   . Gout of right foot 10/21/14  . Gout of right foot   . Hypercholesterolemia 10/21/14  . Diabetes mellitus without complication      Past Surgical History  Procedure Laterality Date  . Eye surgery    . Breast surgery    . Hernia repair Right 10/21/14    Social History Social History  Substance Use Topics  . Smoking status: Former Smoker    Types: Cigarettes    Quit date: 10/21/1974  . Smokeless tobacco: None  . Alcohol Use: No  lives with son, recently discharge from hospital  Family History Family History  Problem Relation Age of Onset  . Hypertension Son   . CVA Mother   . Heart disease Father   no bleeding or clotting disorders  Allergies  Allergen Reactions  . Penicillins Rash     REVIEW OF SYSTEMS (Negative unless checked)  Constitutional: $RemoveBeforeDE'[]'KLGqbfEybKrsOSi$ Weight loss  $Rem'[]'wqZC$ Fever  $Remo'[]'RiOiX$ Chills Cardiac: $RemoveBeforeD'[]'KizWtlpzhBexPT$ Chest pain   '[]'$ Chest pressure   '[]'$ Palpitations   '[]'$ Shortness of breath when laying flat   '[]'$ Shortness of breath at rest   '[]'$ Shortness of breath with exertion. Vascular:  $RemoveBe'[]'kzxnarcmW$ Pain in legs with walking   '[]'$ Pain in legs at rest   '[]'$ Pain in legs when laying flat   '[]'$ Claudication   '[]'$ Pain in feet when walking  $Remove'[]'yvuMRhA$ Pain in feet at rest  $Rem'[]'tqJR$ Pain in feet when laying flat   '[]'$ History of DVT   '[]'$   Phlebitis   [] Swelling in legs   [] Varicose veins   [] Non-healing ulcers Pulmonary:   [] Uses home oxygen   [] Productive cough   [] Hemoptysis   [] Wheeze  [] COPD   [] Asthma Neurologic:  [x] Dizziness  [] Blackouts   [x] Seizures   [x] History of stroke   [] History of TIA  [x] Aphasia   [] Temporary blindness   [] Dysphagia   [] Weakness or numbness in arms   [] Weakness or numbness in legs Musculoskeletal:  [] Arthritis   [] Joint swelling   [] Joint pain   [] Low back pain Hematologic:  [] Easy bruising  [] Easy bleeding   [] Hypercoagulable state   [] Anemic  [] Hepatitis Gastrointestinal:  [] Blood in stool   [] Vomiting blood  [] Gastroesophageal reflux/heartburn   [] Difficulty swallowing. Genitourinary:  [x] Chronic kidney disease   [] Difficult urination  [] Frequent urination  [] Burning with urination   [] Blood in urine Skin:  [] Rashes   [] Ulcers    [] Wounds Psychological:  [] History of anxiety   []  History of major depression.  Physical Examination  Filed Vitals:   11/08/14 2300 11/09/14 0056 11/09/14 0255 11/09/14 0513  BP: 119/58 108/58 136/53 114/47  Pulse: 91 87 83 87  Temp: 98.3 F (36.8 C) 98.3 F (36.8 C) 98.6 F (37 C) 98.7 F (37.1 C)  TempSrc: Oral Oral Oral Oral  Resp: 24 20 18 18   Height:      Weight:      SpO2: 96% 96% 95% 97%   Body mass index is 33.66 kg/(m^2). Gen:  WD/WN, NAD Head: [place]/AT, No temporalis wasting. Prominent temp pulse not noted. Ear/Nose/Throat: Hearing grossly intact, nares w/o erythema or drainage, oropharynx w/o Erythema/Exudate Eyes: PERRLA, EOMI.  Neck: Supple, no nuchal rigidity.  No bruit or JVD.  Pulmonary:  Good air movement, no use of accessory muscles Cardiac: RRR, normal S1, S2, no Murmurs, rubs or gallops. Vascular:  Vessel Right Left  Radial Palpable Palpable  Ulnar Palpable Palpable  Brachial Palpable Palpable  Carotid Palpable, with bruit Palpable, without bruit  Aorta Not palpable N/A  Femoral Palpable Palpable  Popliteal Not Palpable Not Palpable  PT Not Palpable Palpable  DP Palpable Not Palpable   Gastrointestinal: soft, non-tender/non-distended. No guarding/reflex. No masses, surgical incisions, or scars. Musculoskeletal: .  Extremities without ischemic changes.  No deformity or atrophy. Mild LE edema Neurologic: seems to have slight facial droop. Pain and light touch intact in extremities.  Symmetrical.  Speech is difficult to discern.  Psychiatric: patient is confused and speech difficult to discern. Dermatologic: No rashes or ulcers noted.  No cellulitis or open wounds. Lymph : No Cervical, Axillary, or Inguinal lymphadenopathy.    CBC Lab Results  Component Value Date   WBC 15.5* 11/08/2014   HGB 14.5 11/08/2014   HCT 45.5 11/08/2014   MCV 90.5 11/08/2014   PLT 269 11/08/2014    BMET    Component Value Date/Time   NA 139 11/08/2014 0854   NA  137 09/01/2013 1043   K 4.6 11/08/2014 0854   K 4.0 09/01/2013 1043   CL 102 11/08/2014 0854   CL 100 09/01/2013 1043   CO2 26 11/08/2014 0854   CO2 25 09/01/2013 1043   GLUCOSE 104* 11/08/2014 0854   GLUCOSE 131* 09/01/2013 1043   BUN 19 11/08/2014 0854   BUN 16 09/01/2013 1043   CREATININE 0.95 11/08/2014 0854   CREATININE 1.14 09/01/2013 1043   CALCIUM 10.0 11/08/2014 0854   CALCIUM 10.1 09/01/2013 1043   GFRNONAA 54* 11/08/2014 0854   GFRNONAA 45* 09/01/2013 1043   GFRAA >60  11/08/2014 0854   GFRAA 52* 09/01/2013 1043   Estimated Creatinine Clearance: 55.6 mL/min (by C-G formula based on Cr of 0.95).  COAG Lab Results  Component Value Date   INR 1.08 11/08/2014    Radiology Ct Head Wo Contrast  11/08/2014 CLINICAL DATA: Head injury and loss of consciousness after fall. EXAM: CT HEAD WITHOUT CONTRAST CT CERVICAL SPINE WITHOUT CONTRAST TECHNIQUE: Multidetector CT imaging of the head and cervical spine was performed following the standard protocol without intravenous contrast. Multiplanar CT image reconstructions of the cervical spine were also generated. COMPARISON: None. FINDINGS: CT HEAD FINDINGS Bony calvarium appears intact. Sulcal effacement and large area of low density is seen involving the right posterior parietal cortex concerning for acute infarction. No midline shift is noted. Ventricular size is within normal limits. No hemorrhage is noted. CT CERVICAL SPINE FINDINGS Reversal of normal lordosis of cervical spine is noted, most likely due to severe degenerative disc disease present at C4-5, C5-6 and C6-7. Moderate degenerative disc disease is also present at C3-4. No fracture or spondylolisthesis is noted. Mild bilateral posterior facet joint hypertrophy is noted in the upper cervical spine. Visualized lung apices appear normal. IMPRESSION: Probable large area of acute infarction involving the right posterior parietal cortex. MRI may be performed for further  evaluation. Severe multilevel degenerative disc disease is noted in the cervical spine. No fracture or spondylolisthesis is noted. Electronically Signed By: Marijo Conception, M.D. On: 11/08/2014 10:32   Ct Cervical Spine Wo Contrast  11/08/2014 CLINICAL DATA: Head injury and loss of consciousness after fall. EXAM: CT HEAD WITHOUT CONTRAST CT CERVICAL SPINE WITHOUT CONTRAST TECHNIQUE: Multidetector CT imaging of the head and cervical spine was performed following the standard protocol without intravenous contrast. Multiplanar CT image reconstructions of the cervical spine were also generated. COMPARISON: None. FINDINGS: CT HEAD FINDINGS Bony calvarium appears intact. Sulcal effacement and large area of low density is seen involving the right posterior parietal cortex concerning for acute infarction. No midline shift is noted. Ventricular size is within normal limits. No hemorrhage is noted. CT CERVICAL SPINE FINDINGS Reversal of normal lordosis of cervical spine is noted, most likely due to severe degenerative disc disease present at C4-5, C5-6 and C6-7. Moderate degenerative disc disease is also present at C3-4. No fracture or spondylolisthesis is noted. Mild bilateral posterior facet joint hypertrophy is noted in the upper cervical spine. Visualized lung apices appear normal. IMPRESSION: Probable large area of acute infarction involving the right posterior parietal cortex. MRI may be performed for further evaluation. Severe multilevel degenerative disc disease is noted in the cervical spine. No fracture or spondylolisthesis is noted. Electronically Signed By: Marijo Conception, M.D. On: 11/08/2014 10:32   Mr Brain Wo Contrast  11/08/2014 CLINICAL DATA: Found unresponsive 3 days ago. Concern for acute stroke. History of right breast cancer. EXAM: MRI HEAD WITHOUT CONTRAST MRA HEAD WITHOUT CONTRAST TECHNIQUE: Multiplanar, multiecho pulse sequences of the brain and surrounding  structures were obtained without intravenous contrast. Angiographic images of the head were obtained using MRA technique without contrast. COMPARISON: Head CT 11/08/2014 FINDINGS: MRI HEAD FINDINGS There is cortically based, mildly restricted diffusion involving the posterior right insula and anterior right parietal lobe without definite corresponding T2 signal abnormality. There is a heterogeneously T2 hyperintense right posterior temporal intra-axial mass measuring 4.7 x 3.8 cm with heterogeneous areas of internal restricted diffusion. There is moderate surrounding vasogenic edema in the temporal, occipital, and parietal lobes with mass effect on the temporal and occipital horns of the  right lateral ventricle. There is no midline shift. There is mild-to-moderate cerebral atrophy, particularly in the left perisylvian region. No intracranial hemorrhage or extra-axial fluid collection is seen. Prior bilateral cataract extraction is noted. Minimal posterior left ethmoid air cell mucosal thickening is present. Mastoid air cells are clear. Major intracranial vascular flow voids are preserved. MRA HEAD FINDINGS The study is mildly motion degraded. Visualized distal vertebral arteries are patent with the left being slightly dominant. Evaluation of the proximal to mid V4 segments is partly limited due to technical factors with decreased signal in this region. There is suggestion of a moderate to severe proximal left V4 stenosis. PICA origins are not clearly identified. Right AICA and bilateral SCA origins are patent. Basilar artery is patent without stenosis. PCA origins are patent. There is no significant proximal PCA stenosis, although there may be mild to moderate mid right P2 stenosis. The left P1 segment is patent without significant stenosis, however there is an apparent severe stenosis/ short segment occlusion of the proximal left P2 segment with reconstitution distally. Left PCA branch vessels distal the  this are attenuated. The intracranial right ICA is patent with mild supraclinoid stenosis. Intracranial left ICA appears mildly small in caliber diffusely compared to the right which may be developmental as the left A1 segment appears to be either absent or severely hypoplastic. There is diminished flow related enhancement in the distal left carotid siphon which progressively decreases with only a very small amount of signal at the carotid terminus and into the left MCA, suggestive of a severe supraclinoid ICA stenosis. The left M1 segment is grossly patent although poorly evaluated given the decreased signal. There are multiple patent left M2 branches in the sylvian fissure. Right M1 and proximal M2 segments are patent without significant stenosis. There is mild distal right MCA branch vessel irregularity. Right A1 segment is patent without stenosis. Anterior communicating artery is patent. Right ACA supplies the left A2. IMPRESSION: 1. 4.7 cm posterior right temporal lobe mass with moderate surrounding edema. Considerations include metastasis from patient's known breast cancer or primary CNS neoplasm. Further evaluation with post-contrast brain MRI is recommended. 2. Mildly restricted diffusion involving posterior right insula and anterior right parietal cortex, which could reflect a small acute infarct or sequelae of recent seizure activity. 3. Mildly degraded head MRA. Decreased flow related enhancement in the distal left ICA and proximal left MCA concerning for high-grade, flow reducing proximal left supraclinoid ICA stenosis. 4. Suspected moderate to severe proximal left V4 vertebral artery stenosis. 5. Severe stenosis or short segment occlusion of the proximal left P2 segment. Electronically Signed By: Logan Bores On: 11/08/2014 17:35   Ct Abdomen Pelvis W Contrast  11/08/2014 CLINICAL DATA: 81 year old female found unresponsive. Sepsis. Recent urinary tract infection. Initial encounter. EXAM:  CT ABDOMEN AND PELVIS WITH CONTRAST TECHNIQUE: Multidetector CT imaging of the abdomen and pelvis was performed using the standard protocol following bolus administration of intravenous contrast. CONTRAST: 128mL OMNIPAQUE IOHEXOL 350 MG/ML SOLN COMPARISON: Lumbar MRI 02/01/2013. FINDINGS: Cardiomegaly. No pericardial effusion. Bilateral lung base scarring or atelectasis. No pleural effusion or lung base consolidation. Advanced degenerative changes in the spine. Osteopenia. No acute osseous abnormality identified. No pelvic free fluid. Negative uterus and adnexa. Negative rectum aside from mild retained stool. The bladder is mildly distended and there is perivesical stranding (series 2, image 76). There is a 2-3 cm diverticulum at the bladder dome (series 2, image 69 and sagittal image 87). Negative distal left ureters. No gas within the bladder. Large  body habitus and laxity of the ventral abdominal wall. Redundant sigmoid colon. Proximal sigmoid diverticulosis but no active inflammation. Negative left colon. Negative transverse colon. Redundant right colon. The entire right lateral abdomen wall is not included due to large body habitus. No large bowel inflammation identified. Negative terminal ileum. Appendix not identified. No dilated small bowel. No ventral abdominal hernia identified. Postoperative changes to the right ventral abdominal wall. Negative stomach. Liver, spleen, pancreas (atrophied), and adrenal glands are within normal limits. The gallbladder appears to be surgically absent. The portal venous system is patent. Extensive Aortoiliac calcified atherosclerosis noted. Major arterial structures are patent. No abdominal free fluid. No lymphadenopathy. Nonobstructing right midpole 11 mm calculus. Otherwise normal for age kidneys. Renal contrast enhancement and excretion is symmetric. There is a 4.5 cm round and spiculated intermediate density (40 Hounsfield units) right breast mass. See coronal  image 83. IMPRESSION: 1. Bladder wall inflammation compatible with urinary infection. 2-3 cm bladder fundus diverticulum. Kidneys are negative aside from nonobstructing right nephrolithiasis. 2. 4.5 cm right breast mass. Recommend mammographic correlation once the patient recovers from the acute illness. 3. No other acute or inflammatory process in the abdomen or pelvis. 4. Extensive calcified aortic atherosclerosis. Extensive spine degeneration. Electronically Signed By: Genevie Ann M.D. On: 11/08/2014 11:52   US Carotid Bilateral  11/08/2014 CLINICAL DATA: Unresponsive, right temporal mass EXAM: BILATERAL CAROTID DUPLEX ULTRASOUND TECHNIQUE: Pearline Cables scale imaging, color Doppler and duplex ultrasound were performed of bilateral carotid and vertebral arteries in the neck. The examination is limited is a patient did not complete the left half of the study. COMPARISON: None. FINDINGS: Criteria: Quantification of carotid stenosis is based on velocity parameters that correlate the residual internal carotid diameter with NASCET-based stenosis levels, using the diameter of the distal internal carotid lumen as the denominator for stenosis measurement. The following velocity measurements were obtained: RIGHT ICA: 176/44 cm/sec CCA: 85/46 cm/sec SYSTOLIC ICA/CCA RATIO: 3.2 DIASTOLIC ICA/CCA RATIO: 2.9 ECA: 248 cm/sec LEFT Not completed at the patient's wishes RIGHT CAROTID ARTERY: Diffuse atherosclerotic plaque is noted in the region of the carotid bulb and proximal internal carotid artery. The waveforms, velocities and flow velocity ratios suggest a stenosis in the 50-69% range. RIGHT VERTEBRAL ARTERY: Antegrade in nature. IMPRESSION: 50-69% stenosis in the right proximal internal carotid artery. Patient refused further imaging and the left carotid artery was not interrogated. Electronically Signed By: Inez Catalina M.D. On: 11/08/2014 18:41   Dg Chest Port 1 View  11/08/2014 CLINICAL  DATA: Sepsis. EXAM: PORTABLE CHEST - 1 VIEW COMPARISON: November 02, 2014. FINDINGS: Stable cardiomegaly. No pneumothorax or significant pleural effusion is noted. Right lung is clear. Mild left basilar opacity is noted concerning for subsegmental atelectasis or scarring. Severe degenerative changes seen involving the glenohumeral joints bilaterally. IMPRESSION: Mild left basilar subsegmental atelectasis or scarring. Stable cardiomegaly. Electronically Signed By: Marijo Conception, M.D. On: 11/08/2014 09:58   Mr Jodene Nam Head/brain Wo Cm  11/08/2014 CLINICAL DATA: Found unresponsive 3 days ago. Concern for acute stroke. History of right breast cancer. EXAM: MRI HEAD WITHOUT CONTRAST MRA HEAD WITHOUT CONTRAST TECHNIQUE: Multiplanar, multiecho pulse sequences of the brain and surrounding structures were obtained without intravenous contrast. Angiographic images of the head were obtained using MRA technique without contrast. COMPARISON: Head CT 11/08/2014 FINDINGS: MRI HEAD FINDINGS There is cortically based, mildly restricted diffusion involving the posterior right insula and anterior right parietal lobe without definite corresponding T2 signal abnormality. There is a heterogeneously T2 hyperintense right posterior temporal intra-axial mass measuring 4.7 x  3.8 cm with heterogeneous areas of internal restricted diffusion. There is moderate surrounding vasogenic edema in the temporal, occipital, and parietal lobes with mass effect on the temporal and occipital horns of the right lateral ventricle. There is no midline shift. There is mild-to-moderate cerebral atrophy, particularly in the left perisylvian region. No intracranial hemorrhage or extra-axial fluid collection is seen. Prior bilateral cataract extraction is noted. Minimal posterior left ethmoid air cell mucosal thickening is present. Mastoid air cells are clear. Major intracranial vascular flow voids are preserved. MRA HEAD FINDINGS The study  is mildly motion degraded. Visualized distal vertebral arteries are patent with the left being slightly dominant. Evaluation of the proximal to mid V4 segments is partly limited due to technical factors with decreased signal in this region. There is suggestion of a moderate to severe proximal left V4 stenosis. PICA origins are not clearly identified. Right AICA and bilateral SCA origins are patent. Basilar artery is patent without stenosis. PCA origins are patent. There is no significant proximal PCA stenosis, although there may be mild to moderate mid right P2 stenosis. The left P1 segment is patent without significant stenosis, however there is an apparent severe stenosis/ short segment occlusion of the proximal left P2 segment with reconstitution distally. Left PCA branch vessels distal the this are attenuated. The intracranial right ICA is patent with mild supraclinoid stenosis. Intracranial left ICA appears mildly small in caliber diffusely compared to the right which may be developmental as the left A1 segment appears to be either absent or severely hypoplastic. There is diminished flow related enhancement in the distal left carotid siphon which progressively decreases with only a very small amount of signal at the carotid terminus and into the left MCA, suggestive of a severe supraclinoid ICA stenosis. The left M1 segment is grossly patent although poorly evaluated given the decreased signal. There are multiple patent left M2 branches in the sylvian fissure. Right M1 and proximal M2 segments are patent without significant stenosis. There is mild distal right MCA branch vessel irregularity. Right A1 segment is patent without stenosis. Anterior communicating artery is patent. Right ACA supplies the left A2. IMPRESSION: 1. 4.7 cm posterior right temporal lobe mass with moderate surrounding edema. Considerations include metastasis from patient's known breast cancer or primary CNS neoplasm. Further evaluation  with post-contrast brain MRI is recommended. 2. Mildly restricted diffusion involving posterior right insula and anterior right parietal cortex, which could reflect a small acute infarct or sequelae of recent seizure activity. 3. Mildly degraded head MRA. Decreased flow related enhancement in the distal left ICA and proximal left MCA concerning for high-grade, flow reducing proximal left supraclinoid ICA stenosis. 4. Suspected moderate to severe proximal left V4 vertebral artery stenosis. 5. Severe stenosis or short segment occlusion of the proximal left P2 segment. Electronically Signed By: Logan Bores On: 11/08/2014 17:35     Assessment/Plan 1. Admitted with stroke symptoms, seizures.  This is not likely from her carotid disease but much more likely from her large brain metastasis.  This large brain met would also contraindicate any vascular surgical therapy that would be considered. 2. Carotid stenosis.  Moderate on right, unknown no the left.  Intracranial disease on MR.  I have reviewed these studies.  These would all be managed medically even without the large brain met, but with this finding no consideration for surgery or intervention is considered 3. Breast cancer.  Now with brain mets.    Le Ferraz, MD  11/09/2014 1:50 PM

## 2014-11-09 NOTE — Evaluation (Signed)
Clinical/Bedside Swallow Evaluation Patient Details  Name: Allison Gonzalez MRN: [id number] Date of Birth: [birth year]  Today's Date: 11/09/2014 Time: SLP Start Time (ACUTE ONLY): 57 SLP Stop Time (ACUTE ONLY): 1130 SLP Time Calculation (min) (ACUTE ONLY): 60 min  Past Medical History:  Past Medical History  Diagnosis Date  . Breast cancer 2015    positive w/ radiation  . Hypertension   . Depression   . Anxiety   . Sleep apnea   . Hypothyroidism   . Gout of right foot 10/21/14  . Gout of right foot   . Hypercholesterolemia 10/21/14  . Diabetes mellitus without complication    Past Surgical History:  Past Surgical History  Procedure Laterality Date  . Eye surgery    . Breast surgery    . Hernia repair Right 10/21/14   HPI:  Pt is an 81 y/o female recently d/c'd from this facility w/ UTI tx. Pt has a h/o breat CA w/ radiation tx, HTN, DM, UTI. Upon admission, she was found to have a 4.7 cm posterior right temporal lobe mass with moderate surrounding edema worrisome for metastasis from patient's known breast cancer or primary CNS neoplasm. Possible small acute infarct or sequelae of recent seizure activity. Decreased flow related enhancement in the distal left ICA and proximal left MCA concerning for high-grade, flow reducing proximal left supraclinoid ICA stenosis. Also moderate to severe proximal left V4 vertebral artery stenosis along with Severe stenosis or short segment occlusion of the proximal left P2 segment. Pt is currently NPO.    Assessment / Plan / Recommendation Clinical Impression  Pt presented w/ oral phase deficits hampered by Cognitive deficits and Apraxic-like behaviors suspect d/t pt's Neurological status, possibly R CVA. Noted min. Left labial weakness and tone, decreased sensation w/ bolus residue in corner of mouth. The poor attention to bolus and the apraxic-like behaviors w/ disorganized self-feeding awareness imparired the task of feeding herself and eating. Pt  could not allow herself to be assisted w/ the feeding and demo. exagerated UE movements such as over-gripping the cup and spilling food/liquid. Pt demo. overt coughing w/ trials of thin liquids suspected d/t the impulsive drinking and too much liquid in the mouth at one time for adequate control of it. Pt appeared to better tolerate trials of purees and Nectar liquids w/ full assistance w/ feeding. Noted pt demo. Left neglect and reduced proprioceptive awareness. Pt requires continued assessment. Pt would benefit from a Dys. I w/ nectar liquids diet at this time; strict aspiration precautions; meds in Puree. ST will f/u w/ trials to upgrade; continued Cognitive assessment. Education given to Allison Gonzalez.       Aspiration Risk  Moderate    Diet Recommendation Dysphagia 1 (Puree);Nectar   Medication Administration: Crushed with puree Compensations: Slow rate;Small sips/bites;Minimize environmental distractions;Check for pocketing    Other  Recommendations Recommended Consults:  (Dietician consult) Oral Care Recommendations: Oral care BID;Staff/trained caregiver to provide oral care Other Recommendations: Order thickener from pharmacy;Remove water pitcher   Follow Up Recommendations       Frequency and Duration min 3x week  1 week   Pertinent Vitals/Pain denied    SLP Swallow Goals  see care plan   Swallow Study Prior Functional Status  Type of Home: House Available Help at Discharge: Family    General Date of Onset: 11/08/14 Other Pertinent Information: Pt is an 81 y/o female recently d/c'd from this facility w/ UTI tx. Pt has a h/o breat CA w/ radiation tx, HTN, DM, UTI.  Upon admission, she was found to have a 4.7 cm posterior right temporal lobe mass with moderate surrounding edema worrisome for metastasis from patient's known breast cancer or primary CNS neoplasm. Possible small acute infarct or sequelae of recent seizure activity. Decreased flow related enhancement in the distal left ICA  and proximal left MCA concerning for high-grade, flow reducing proximal left supraclinoid ICA stenosis. Also moderate to severe proximal left V4 vertebral artery stenosis along with Severe stenosis or short segment occlusion of the proximal left P2 segment. Pt is currently NPO.  Type of Study: Bedside swallow evaluation Previous Swallow Assessment: none Diet Prior to this Study: Regular;Thin liquids (at home; currently NPO) Temperature Spikes Noted: No (wbc 15.5) Respiratory Status: Room air History of Recent Intubation: No Behavior/Cognition: Alert;Cooperative;Confused;Requires cueing;Distractible;Impulsive (Apraxic-like behaviors) Oral Cavity - Dentition: Adequate natural dentition/normal for age Self-Feeding Abilities: Total assist (confusion in organizing the task) Patient Positioning: Upright in bed Baseline Vocal Quality: Normal Volitional Cough: Cognitively unable to elicit Volitional Swallow: Able to elicit    Oral/Motor/Sensory Function Overall Oral Motor/Sensory Function:  (unable to fully assess d/t pt's inability to follow commands) Labial Symmetry: Abnormal symmetry left Labial Strength: Reduced Labial Sensation: Reduced (Left) Lingual ROM: Reduced left (possibly) Mandible: Within Functional Limits   Ice Chips Ice chips: Within functional limits Presentation: Spoon (fed; 5 trials)   Thin Liquid Thin Liquid: Impaired Presentation: Cup (assisted) Oral Phase Impairments: Poor awareness of bolus;Other (comment) (apraxic-like behaviors) Oral Phase Functional Implications:  (spillage) Pharyngeal  Phase Impairments: Cough - Immediate (x1/3)    Nectar Thick Nectar Thick Liquid: Impaired Presentation: Cup;Straw (assisted) Oral Phase Impairments: Poor awareness of bolus Oral phase functional implications:  (spillage) Pharyngeal Phase Impairments:  (none)   Honey Thick Honey Thick Liquid: Not tested   Puree Puree: Impaired Presentation: Spoon;Self Fed (assisted) Oral Phase  Impairments: Poor awareness of bolus Oral Phase Functional Implications:  (spillage) Pharyngeal Phase Impairments:  (none)   Solid   GO    Solid: Not tested      Allison Kenner, MS, CCC-SLP  Allison Gonzalez 11/09/2014,2:10 PM

## 2014-11-09 NOTE — Progress Notes (Addendum)
[facility] [place] NAME: Allison Gonzalez    MR#:  [id number]  DATE OF BIRTH:  1929-09-21  SUBJECTIVE:  CHIEF COMPLAINT:   Chief Complaint  Patient presents with  . Recurrent UTI   much more awake and alert, although pleasantly confused, son at bedside  REVIEW OF SYSTEMS:  Review of Systems  Constitutional: Negative for fever, weight loss, malaise/fatigue and diaphoresis.  HENT: Negative for ear discharge, ear pain, hearing loss, nosebleeds, sore throat and tinnitus.   Eyes: Negative for blurred vision and pain.  Respiratory: Negative for cough, hemoptysis, shortness of breath and wheezing.   Cardiovascular: Negative for chest pain, palpitations, orthopnea and leg swelling.  Gastrointestinal: Negative for heartburn, nausea, vomiting, abdominal pain, diarrhea, constipation and blood in stool.  Genitourinary: Negative for dysuria, urgency and frequency.  Musculoskeletal: Negative for myalgias and back pain.  Skin: Negative for itching and rash.  Neurological: Positive for seizures and loss of consciousness. Negative for dizziness, tingling, tremors, focal weakness, weakness and headaches.  Psychiatric/Behavioral: Positive for depression. The patient is not nervous/anxious.    DRUG ALLERGIES:   Allergies  Allergen Reactions  . Penicillins Rash   VITALS:  Blood pressure 114/47, pulse 87, temperature 98.7 F (37.1 C), temperature source Oral, resp. rate 18, height 5\' 8"  (1.727 m), weight 100.4 kg (221 lb 5.5 oz), SpO2 97 %. PHYSICAL EXAMINATION:  Physical Exam  Constitutional: She is well-developed, well-nourished, and in no distress.  HENT:  Head: Normocephalic and atraumatic.  Eyes: Conjunctivae and EOM are normal. Pupils are equal, round, and reactive to light.  Neck: Normal range of motion. Neck supple. No tracheal deviation present. No thyromegaly present.  Cardiovascular: Normal rate, regular rhythm and normal heart sounds.    Pulmonary/Chest: Effort normal and breath sounds normal. No respiratory distress. She has no wheezes. She exhibits no tenderness.  Abdominal: Soft. Bowel sounds are normal. She exhibits no distension. There is no tenderness.  Musculoskeletal: Normal range of motion.  Neurological: She is alert. No cranial nerve deficit.  Pleasantly confused  Skin: Skin is warm and dry. No rash noted.  Psychiatric: Mood and affect normal.   LABORATORY PANEL:   CBC  Recent Labs Lab 11/08/14 0854  WBC 15.5*  HGB 14.5  HCT 45.5  PLT 269   ------------------------------------------------------------------------------------------------------------------ Chemistries   Recent Labs Lab 11/08/14 0854  NA 139  K 4.6  CL 102  CO2 26  GLUCOSE 104*  BUN 19  CREATININE 0.95  CALCIUM 10.0  AST 38  ALT 23  ALKPHOS 122  BILITOT 0.4   RADIOLOGY:  Ct Head Wo Contrast  11/08/2014   CLINICAL DATA:  Head injury and loss of consciousness after fall.  EXAM: CT HEAD WITHOUT CONTRAST  CT CERVICAL SPINE WITHOUT CONTRAST  TECHNIQUE: Multidetector CT imaging of the head and cervical spine was performed following the standard protocol without intravenous contrast. Multiplanar CT image reconstructions of the cervical spine were also generated.  COMPARISON:  None.  FINDINGS: CT HEAD FINDINGS  Bony calvarium appears intact. Sulcal effacement and large area of low density is seen involving the right posterior parietal cortex concerning for acute infarction. No midline shift is noted. Ventricular size is within normal limits. No hemorrhage is noted.  CT CERVICAL SPINE FINDINGS  Reversal of normal lordosis of cervical spine is noted, most likely due to severe degenerative disc disease present at C4-5, C5-6 and C6-7. Moderate degenerative disc disease is also present at C3-4. No fracture or spondylolisthesis  is noted. Mild bilateral posterior facet joint hypertrophy is noted in the upper cervical spine. Visualized lung apices  appear normal.  IMPRESSION: Probable large area of acute infarction involving the right posterior parietal cortex. MRI may be performed for further evaluation.  Severe multilevel degenerative disc disease is noted in the cervical spine. No fracture or spondylolisthesis is noted.   Electronically Signed   By: Marijo Conception, M.D.   On: 11/08/2014 10:32   Ct Cervical Spine Wo Contrast  11/08/2014   CLINICAL DATA:  Head injury and loss of consciousness after fall.  EXAM: CT HEAD WITHOUT CONTRAST  CT CERVICAL SPINE WITHOUT CONTRAST  TECHNIQUE: Multidetector CT imaging of the head and cervical spine was performed following the standard protocol without intravenous contrast. Multiplanar CT image reconstructions of the cervical spine were also generated.  COMPARISON:  None.  FINDINGS: CT HEAD FINDINGS  Bony calvarium appears intact. Sulcal effacement and large area of low density is seen involving the right posterior parietal cortex concerning for acute infarction. No midline shift is noted. Ventricular size is within normal limits. No hemorrhage is noted.  CT CERVICAL SPINE FINDINGS  Reversal of normal lordosis of cervical spine is noted, most likely due to severe degenerative disc disease present at C4-5, C5-6 and C6-7. Moderate degenerative disc disease is also present at C3-4. No fracture or spondylolisthesis is noted. Mild bilateral posterior facet joint hypertrophy is noted in the upper cervical spine. Visualized lung apices appear normal.  IMPRESSION: Probable large area of acute infarction involving the right posterior parietal cortex. MRI may be performed for further evaluation.  Severe multilevel degenerative disc disease is noted in the cervical spine. No fracture or spondylolisthesis is noted.   Electronically Signed   By: Marijo Conception, M.D.   On: 11/08/2014 10:32   Mr Brain Wo Contrast  11/08/2014   CLINICAL DATA:  Found unresponsive 3 days ago. Concern for acute stroke. History of right breast  cancer.  EXAM: MRI HEAD WITHOUT CONTRAST  MRA HEAD WITHOUT CONTRAST  TECHNIQUE: Multiplanar, multiecho pulse sequences of the brain and surrounding structures were obtained without intravenous contrast. Angiographic images of the head were obtained using MRA technique without contrast.  COMPARISON:  Head CT 11/08/2014  FINDINGS: MRI HEAD FINDINGS  There is cortically based, mildly restricted diffusion involving the posterior right insula and anterior right parietal lobe without definite corresponding T2 signal abnormality. There is a heterogeneously T2 hyperintense right posterior temporal intra-axial mass measuring 4.7 x 3.8 cm with heterogeneous areas of internal restricted diffusion. There is moderate surrounding vasogenic edema in the temporal, occipital, and parietal lobes with mass effect on the temporal and occipital horns of the right lateral ventricle. There is no midline shift.  There is mild-to-moderate cerebral atrophy, particularly in the left perisylvian region. No intracranial hemorrhage or extra-axial fluid collection is seen.  Prior bilateral cataract extraction is noted. Minimal posterior left ethmoid air cell mucosal thickening is present. Mastoid air cells are clear. Major intracranial vascular flow voids are preserved.  MRA HEAD FINDINGS  The study is mildly motion degraded.  Visualized distal vertebral arteries are patent with the left being slightly dominant. Evaluation of the proximal to mid V4 segments is partly limited due to technical factors with decreased signal in this region. There is suggestion of a moderate to severe proximal left V4 stenosis. PICA origins are not clearly identified. Right AICA and bilateral SCA origins are patent. Basilar artery is patent without stenosis. PCA origins are patent. There is  no significant proximal PCA stenosis, although there may be mild to moderate mid right P2 stenosis. The left P1 segment is patent without significant stenosis, however there is an  apparent severe stenosis/ short segment occlusion of the proximal left P2 segment with reconstitution distally. Left PCA branch vessels distal the this are attenuated.  The intracranial right ICA is patent with mild supraclinoid stenosis. Intracranial left ICA appears mildly small in caliber diffusely compared to the right which may be developmental as the left A1 segment appears to be either absent or severely hypoplastic. There is diminished flow related enhancement in the distal left carotid siphon which progressively decreases with only a very small amount of signal at the carotid terminus and into the left MCA, suggestive of a severe supraclinoid ICA stenosis. The left M1 segment is grossly patent although poorly evaluated given the decreased signal. There are multiple patent left M2 branches in the sylvian fissure. Right M1 and proximal M2 segments are patent without significant stenosis. There is mild distal right MCA branch vessel irregularity. Right A1 segment is patent without stenosis. Anterior communicating artery is patent. Right ACA supplies the left A2.  IMPRESSION: 1. 4.7 cm posterior right temporal lobe mass with moderate surrounding edema. Considerations include metastasis from patient's known breast cancer or primary CNS neoplasm. Further evaluation with post-contrast brain MRI is recommended. 2. Mildly restricted diffusion involving posterior right insula and anterior right parietal cortex, which could reflect a small acute infarct or sequelae of recent seizure activity. 3. Mildly degraded head MRA. Decreased flow related enhancement in the distal left ICA and proximal left MCA concerning for high-grade, flow reducing proximal left supraclinoid ICA stenosis. 4. Suspected moderate to severe proximal left V4 vertebral artery stenosis. 5. Severe stenosis or short segment occlusion of the proximal left P2 segment.   Electronically Signed   By: Logan Bores   On: 11/08/2014 17:35   Ct Abdomen Pelvis  W Contrast  11/08/2014   CLINICAL DATA:  81 year old female found unresponsive. Sepsis. Recent urinary tract infection. Initial encounter.  EXAM: CT ABDOMEN AND PELVIS WITH CONTRAST  TECHNIQUE: Multidetector CT imaging of the abdomen and pelvis was performed using the standard protocol following bolus administration of intravenous contrast.  CONTRAST:  178mL OMNIPAQUE IOHEXOL 350 MG/ML SOLN  COMPARISON:  Lumbar MRI 02/01/2013.  FINDINGS: Cardiomegaly. No pericardial effusion. Bilateral lung base scarring or atelectasis. No pleural effusion or lung base consolidation.  Advanced degenerative changes in the spine. Osteopenia. No acute osseous abnormality identified.  No pelvic free fluid. Negative uterus and adnexa. Negative rectum aside from mild retained stool.  The bladder is mildly distended and there is perivesical stranding (series 2, image 76). There is a 2-3 cm diverticulum at the bladder dome (series 2, image 69 and sagittal image 87). Negative distal left ureters. No gas within the bladder.  Large body habitus and laxity of the ventral abdominal wall. Redundant sigmoid colon. Proximal sigmoid diverticulosis but no active inflammation. Negative left colon. Negative transverse colon. Redundant right colon. The entire right lateral abdomen wall is not included due to large body habitus. No large bowel inflammation identified. Negative terminal ileum. Appendix not identified. No dilated small bowel. No ventral abdominal hernia identified. Postoperative changes to the right ventral abdominal wall. Negative stomach.  Liver, spleen, pancreas (atrophied), and adrenal glands are within normal limits. The gallbladder appears to be surgically absent. The portal venous system is patent. Extensive Aortoiliac calcified atherosclerosis noted. Major arterial structures are patent. No abdominal free fluid. No lymphadenopathy.  Nonobstructing right midpole 11 mm calculus. Otherwise normal for age kidneys. Renal contrast  enhancement and excretion is symmetric.  There is a 4.5 cm round and spiculated intermediate density (40 Hounsfield units) right breast mass. See coronal image 83.  IMPRESSION: 1. Bladder wall inflammation compatible with urinary infection. 2-3 cm bladder fundus diverticulum. Kidneys are negative aside from nonobstructing right nephrolithiasis. 2. 4.5 cm right breast mass. Recommend mammographic correlation once the patient recovers from the acute illness. 3. No other acute or inflammatory process in the abdomen or pelvis. 4. Extensive calcified aortic atherosclerosis. Extensive spine degeneration.   Electronically Signed   By: Genevie Ann M.D.   On: 11/08/2014 11:52   US Carotid Bilateral  11/08/2014   CLINICAL DATA:  Unresponsive, right temporal mass  EXAM: BILATERAL CAROTID DUPLEX ULTRASOUND  TECHNIQUE: Pearline Cables scale imaging, color Doppler and duplex ultrasound were performed of bilateral carotid and vertebral arteries in the neck. The examination is limited is a patient did not complete the left half of the study.  COMPARISON:  None.  FINDINGS: Criteria: Quantification of carotid stenosis is based on velocity parameters that correlate the residual internal carotid diameter with NASCET-based stenosis levels, using the diameter of the distal internal carotid lumen as the denominator for stenosis measurement.  The following velocity measurements were obtained:  RIGHT  ICA:  176/44 cm/sec  CCA:  67/67 cm/sec  SYSTOLIC ICA/CCA RATIO:  3.2  DIASTOLIC ICA/CCA RATIO:  2.9  ECA:  248 cm/sec  LEFT  Not completed at the patient's wishes  RIGHT CAROTID ARTERY: Diffuse atherosclerotic plaque is noted in the region of the carotid bulb and proximal internal carotid artery. The waveforms, velocities and flow velocity ratios suggest a stenosis in the 50-69% range.  RIGHT VERTEBRAL ARTERY:  Antegrade in nature.  IMPRESSION: 50-69% stenosis in the right proximal internal carotid artery.  Patient refused further imaging and the left  carotid artery was not interrogated.   Electronically Signed   By: Inez Catalina M.D.   On: 11/08/2014 18:41   Dg Chest Port 1 View  11/08/2014   CLINICAL DATA:  Sepsis.  EXAM: PORTABLE CHEST - 1 VIEW  COMPARISON:  November 02, 2014.  FINDINGS: Stable cardiomegaly. No pneumothorax or significant pleural effusion is noted. Right lung is clear. Mild left basilar opacity is noted concerning for subsegmental atelectasis or scarring. Severe degenerative changes seen involving the glenohumeral joints bilaterally.  IMPRESSION: Mild left basilar subsegmental atelectasis or scarring. Stable cardiomegaly.   Electronically Signed   By: Marijo Conception, M.D.   On: 11/08/2014 09:58   Mr Jodene Nam Head/brain Wo Cm  11/08/2014   CLINICAL DATA:  Found unresponsive 3 days ago. Concern for acute stroke. History of right breast cancer.  EXAM: MRI HEAD WITHOUT CONTRAST  MRA HEAD WITHOUT CONTRAST  TECHNIQUE: Multiplanar, multiecho pulse sequences of the brain and surrounding structures were obtained without intravenous contrast. Angiographic images of the head were obtained using MRA technique without contrast.  COMPARISON:  Head CT 11/08/2014  FINDINGS: MRI HEAD FINDINGS  There is cortically based, mildly restricted diffusion involving the posterior right insula and anterior right parietal lobe without definite corresponding T2 signal abnormality. There is a heterogeneously T2 hyperintense right posterior temporal intra-axial mass measuring 4.7 x 3.8 cm with heterogeneous areas of internal restricted diffusion. There is moderate surrounding vasogenic edema in the temporal, occipital, and parietal lobes with mass effect on the temporal and occipital horns of the right lateral ventricle. There is no midline shift.  There is  mild-to-moderate cerebral atrophy, particularly in the left perisylvian region. No intracranial hemorrhage or extra-axial fluid collection is seen.  Prior bilateral cataract extraction is noted. Minimal posterior left  ethmoid air cell mucosal thickening is present. Mastoid air cells are clear. Major intracranial vascular flow voids are preserved.  MRA HEAD FINDINGS  The study is mildly motion degraded.  Visualized distal vertebral arteries are patent with the left being slightly dominant. Evaluation of the proximal to mid V4 segments is partly limited due to technical factors with decreased signal in this region. There is suggestion of a moderate to severe proximal left V4 stenosis. PICA origins are not clearly identified. Right AICA and bilateral SCA origins are patent. Basilar artery is patent without stenosis. PCA origins are patent. There is no significant proximal PCA stenosis, although there may be mild to moderate mid right P2 stenosis. The left P1 segment is patent without significant stenosis, however there is an apparent severe stenosis/ short segment occlusion of the proximal left P2 segment with reconstitution distally. Left PCA branch vessels distal the this are attenuated.  The intracranial right ICA is patent with mild supraclinoid stenosis. Intracranial left ICA appears mildly small in caliber diffusely compared to the right which may be developmental as the left A1 segment appears to be either absent or severely hypoplastic. There is diminished flow related enhancement in the distal left carotid siphon which progressively decreases with only a very small amount of signal at the carotid terminus and into the left MCA, suggestive of a severe supraclinoid ICA stenosis. The left M1 segment is grossly patent although poorly evaluated given the decreased signal. There are multiple patent left M2 branches in the sylvian fissure. Right M1 and proximal M2 segments are patent without significant stenosis. There is mild distal right MCA branch vessel irregularity. Right A1 segment is patent without stenosis. Anterior communicating artery is patent. Right ACA supplies the left A2.  IMPRESSION: 1. 4.7 cm posterior right  temporal lobe mass with moderate surrounding edema. Considerations include metastasis from patient's known breast cancer or primary CNS neoplasm. Further evaluation with post-contrast brain MRI is recommended. 2. Mildly restricted diffusion involving posterior right insula and anterior right parietal cortex, which could reflect a small acute infarct or sequelae of recent seizure activity. 3. Mildly degraded head MRA. Decreased flow related enhancement in the distal left ICA and proximal left MCA concerning for high-grade, flow reducing proximal left supraclinoid ICA stenosis. 4. Suspected moderate to severe proximal left V4 vertebral artery stenosis. 5. Severe stenosis or short segment occlusion of the proximal left P2 segment.   Electronically Signed   By: Logan Bores   On: 11/08/2014 17:35   ASSESSMENT AND PLAN:   * Acute CVA: 4.7 cm posterior right temporal lobe mass with moderate surrounding edema worrisome for metastasis from patient's known breast cancer or primary CNS neoplasm. Possible small acute infarct or sequelae of recent seizure activity. Decreased flow related enhancement in the distal left ICA and proximal left MCA concerning for high-grade, flow reducing proximal left supraclinoid ICA stenosis. Also moderate to severe proximal left V4 vertebral artery stenosis along with Severe stenosis or short segment occlusion of the proximal left P2 segment.  We will consult vascular surgery Continue aspirin and statin neurologist consult, get PT, OT and speech study.  * Seizure: Likely from CVA or mass effect, and neurology input.  * Sepsis: present on admission due to Recurrent UTI: Continue antibiotics.  Stop Azabactam and continue vancomycin + Levaquin for now, her blood c/s  growing GPC. Will c/s ID  * Uncontrolled Hypertension: We will let blood pressure auto regulate in acute CVA phase.  Systolic blood pressure goal 140-160  * Hyperlipidemia: Continue statin  * History of breast cancer  with right breat mass ( 4.5 cm): We will consult oncology, follows with Dr. Oliva Bustard    Fall, aspiration and seizure precaution.   All the records are reviewed and case discussed with Care Management/Social Worker. Management plans discussed with the patient, family (oldest son who is at bedside) and they are in agreement.  CODE STATUS: Full code  Please note, she was DO NOT RESUSCITATE, but family changes decision and made her full code.  Consult palliative care   TOTAL TIME TAKING CARE OF THIS PATIENT: 35 minutes.   More than 50% of the time was spent in counseling/coordination of care: YES ( discussed with son at bedside)  POSSIBLE D/C IN 2-3 DAYS, DEPENDING ON CLINICAL CONDITION.  And neurology evaluation   [facility], Quaneshia Wareing M.D on 11/09/2014 at 7:10 AM  Between 7am to 6pm - Pager - [phone]  After 6pm go to [email or web address] - password [email or web address] [facility]  [facility]  Office  [phone]  CC: Primary care physician; Adrian Prows, MD

## 2014-11-09 NOTE — Evaluation (Addendum)
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  Hospital course also significant for witnessed seizure activity; started on Keppra.    Clinical Impression  Pt was able to understand one-step commands but couldn't sequence movements correctly as seen by her inability to complete heel slides. PT suspects that it's secondary to apraxia, as pt was able to complete movements that don't involve as much premotor planning, as seen with combing her hair (automatic tasks).  Regarding A&O x 4, pt was unable to determine the date and why she was in the hospital. Pt presents with noted perseveration, stating "I think I'm left handed" in response to every movement that was asked of her. Pt also demonstrates L inattention and this inhibits her from completing movements on her L side.  Suspect sensory deficit throughout L hemi-body (UE > LE); minimal/no response to deep, noxious stimuli.  Regarding bed mobility, pt was able to sit at the EOB with mod assist and is able to complete some ADL activities in seated position, such as combing her hair. With 2+ physical assist pt is able to stand with RW but needed to sit down after ~15 sec.  Unsafe/unable for stepping or OOB attempts this date.  Pt would benefit from skilled PT in order to improve with transfers, decrease L inattention, improve cognition with motor sequencing, and increase gross strength.     Follow Up Recommendations SNF    Equipment Recommendations  Rolling walker with 5" wheels    Recommendations for Other Services       Precautions / Restrictions Precautions Precautions: Fall, seizure, NPO Restrictions Weight Bearing Restrictions: No       Mobility  Bed Mobility Overal bed mobility: Needs Assistance Bed Mobility: Supine to Sit     Supine to sit: Mod assist     General bed mobility comments: Pt was able to assist with UE/LE but she is not at the strength that she needs to be at.  Pt demonstrates poor trunk control and requires mod assist for bed scoot to the EOB.   Transfers Overall transfer level: Needs assistance Equipment used: Rolling walker (2 wheeled) Transfers: Sit to/from Stand Sit to Stand: +2 physical assistance;Mod assist;Max assist         General transfer comment: Pt was able to stand up with 2+ mod-max assist with verbal/tactile cuing to lean forward secondary to posterior trunk lean.   Ambulation/Gait             General Gait Details: non-ambulatory at baseline  Stairs            Wheelchair Mobility    Modified Rankin (Stroke Patients Only)       Balance Overall balance assessment: Needs assistance Sitting-balance support: No upper extremity supported;Single extremity supported Sitting balance-Leahy Scale: Good Sitting balance - Comments: Pt was able to comb hair and hold pen while sitting unsopported at the EOB.    Standing balance support: Bilateral upper extremity supported Standing balance-Leahy Scale: Poor Standing balance comment: Pt demonstrates posterior trunk lean and he LEs mad contact with the bed for additional support.  Pt was only able to stand up for ~15sec and had to sit down due to fatigue.  Pertinent Vitals/Pain Pain Assessment: No/denies pain    Home Living Family/patient expects to be discharged to:: Private residence Living Arrangements: Children Available Help at Discharge: Family Type of Home: House Home Access: Ramped entrance     [place]: One level Home Equipment: Environmental consultant - 4 wheels;Hospital bed;Wheelchair - manual      Prior Function Level of Independence: Needs assistance (Pt ambulates via WC  and children assist with transfers to bed/chair. Regarding ADLs, pt requires son for assist with hygiene. )               Hand Dominance   Dominant Hand: Left    Extremity/Trunk Assessment   Upper Extremity Assessment:  (R UE grossly at least 3/5, shoulder elevation limited by chronic arthritic changes.  L UE with increased activation, difficulty with motor planning (initiation/termination of movement); strength at least 3-/5)  Tends to maintain UE in flexed/grasp position, but able to move throughout full range when assisted by therapist.           Lower Extremity Assessment:  (R LE grossly at least 3/5.  L LE with increased activation (tends to maintain in extension), difficulty with motor planning (initiation/termination of movement); strength at least 3-/5)         Communication   Communication:  (verbally perseverative)  Cognition Arousal/Alertness: Lethargic Behavior During Therapy: Flat affect;WFL for tasks assessed/performed Overall Cognitive Status: Impaired/Different from baseline Area of Impairment: Orientation;Attention;Memory;Following commands;Problem solving  Moderate L inattention noted; maintains R cervical rotation with all resting postures/positions               General Comments: Pt was able to understand one-step commands but couldn't sequence movements correctly, PT suggests that it's secondary to apraxia.  Regarding A&O x 4, pt was unable to determine the date and why she was in the hospital. Pt presents with perserveration, stating "I think I'm left handed" in response to every movement that was asked of her.    General Comments      Exercises Other Exercises Other Exercises: Supine bilat ankle pumps/heel slides, 1 x 5, pt required constant cuing in order to initiate desired movement secondary to L inattention and suspected apraxia affecting her movement sequencing.  Other Exercises: Educated family on positioning to L of bedside when visiting  to promote cervical rotation and visual scanning to L of midline.  Son voiced understanding. Other Exercises: Provided patient with 3 common objects (comb, toothbrush and pen).  Patient able to verbalize use of 2/3 (but verbally perseverative), demonstrate use of 1/3.  Performance significant for functional motor apraxia.  improved use of L UE with automatic tasks vs. isolated, on-command tasks. (15 min)      Assessment/Plan    PT Assessment Patient needs continued PT services  PT Diagnosis Generalized weakness;Difficulty walking;Altered mental status   PT Problem List Decreased range of motion;Decreased activity tolerance;Decreased mobility;Decreased balance;Decreased safety awareness;Decreased cognition;Decreased coordination;Decreased strength  PT Treatment Interventions DME instruction;Gait training;Stair training;Functional mobility training;Therapeutic activities;Balance training;Patient/family education;Therapeutic exercise   PT Goals (Current goals can be found in the Care Plan section) Acute Rehab PT Goals Patient Stated Goal: "to go back home" PT Goal Formulation: With patient Time For Goal Achievement: 11/23/14 Potential to Achieve Goals: Fair    Frequency 7X/week   Barriers to discharge Inaccessible home environment Pt unable to stand and transfer without 2+ physical assist.     Co-evaluation               End of Session Equipment Utilized  During Treatment: Gait belt Activity Tolerance: Patient limited by fatigue Patient left: with call bell/phone within reach;in bed;with bed alarm set;with family/visitor present Nurse Communication: Mobility status         Time: 0912-0957 PT Time Calculation (min) (ACUTE ONLY): 45 min   Charges:         PT G Codes:        Bernestine Amass, SPT 11/09/2014 2:28 PM   Evaluation completed and documented by Zannie Kehr, SPT  This patient note, response to treatment and overall treatment plan has been reviewed and this  clinician agrees with the information provided.  Devynn Scheff H. Owens Shark, PT, DPT, NCS 11/09/2014, 2:46 PM [phone]

## 2014-11-10 ENCOUNTER — Inpatient Hospital Stay: Payer: Medicare Other

## 2014-11-10 LAB — CBC
HEMATOCRIT: 39.7 % (ref 35.0–47.0)
Hemoglobin: 13 g/dL (ref 12.0–16.0)
MCH: 29.5 pg (ref 26.0–34.0)
MCHC: 32.7 g/dL (ref 32.0–36.0)
MCV: 90.3 fL (ref 80.0–100.0)
PLATELETS: 212 10*3/uL (ref 150–440)
RBC: 4.4 MIL/uL (ref 3.80–5.20)
RDW: 15.5 % — AB (ref 11.5–14.5)
WBC: 7.2 10*3/uL (ref 3.6–11.0)

## 2014-11-10 LAB — BASIC METABOLIC PANEL
ANION GAP: 9 (ref 5–15)
BUN: 14 mg/dL (ref 6–20)
CALCIUM: 9.3 mg/dL (ref 8.9–10.3)
CO2: 25 mmol/L (ref 22–32)
Chloride: 103 mmol/L (ref 101–111)
Creatinine, Ser: 0.73 mg/dL (ref 0.44–1.00)
GLUCOSE: 109 mg/dL — AB (ref 65–99)
POTASSIUM: 3.7 mmol/L (ref 3.5–5.1)
Sodium: 137 mmol/L (ref 135–145)

## 2014-11-10 MED ORDER — GADOBENATE DIMEGLUMINE 529 MG/ML IV SOLN
20.0000 mL | Freq: Once | INTRAVENOUS | Status: AC | PRN
  Administered 2014-11-10: 20 mL via INTRAVENOUS

## 2014-11-10 MED ORDER — IOHEXOL 300 MG/ML  SOLN
75.0000 mL | Freq: Once | INTRAMUSCULAR | Status: AC | PRN
  Administered 2014-11-10: 75 mL via INTRAVENOUS

## 2014-11-10 NOTE — Progress Notes (Signed)
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  Hospital course also significant for witnessed seizure activity; started on Keppra. 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     PT Comments    Initially, pt was incompliant to PT session secondary to tiredness, but agreed to comply with strong encouragement.  Pt was able to sit at the EOB with mod assist secondary to confusion/weakness.  Pt was able to sit at the EOB with min assist for ~11min with UE supported by table and complete therex.  Pt is able to identify objects but can't complete movements that assist her in using objects secondary to her apraxia, an example is correctly identifying the comb, but not understanding how to move her arm to comb her hair.  Pt would continue to benefit from skilled PT in order to improve with movement sequencing, transfers, and cognition with therex.     Follow Up Recommendations  SNF     Equipment Recommendations  Rolling walker with 5" wheels    Recommendations for Other Services       Precautions / Restrictions Precautions Precautions: Fall Restrictions Weight Bearing Restrictions: No    Mobility  Bed Mobility Overal bed mobility: Needs Assistance Bed Mobility: Supine to Sit     Supine to sit: Mod assist     General bed mobility comments: Pt was able to assist with UE/LE but she is not at the strength that she needs to be at.  Pt demonstrates poor trunk control and requires mod assist for bed scoot to the EOB.   Transfers                    Ambulation/Gait                 Stairs            Wheelchair Mobility    Modified Rankin (Stroke Patients Only)        Balance Overall balance assessment: Needs assistance Sitting-balance support: Bilateral upper extremity supported;Single extremity supported Sitting balance-Leahy Scale: Good Sitting balance - Comments: Pt was able to sit at the EOB for ~20 min of the session with support from the table/bedrails.                            Cognition Arousal/Alertness: Lethargic Behavior During Therapy: Flat affect Overall Cognitive Status: Impaired/Different from baseline Area of Impairment: Orientation;Attention;Memory;Following commands;Safety/judgement;Problem solving Orientation Level: Disoriented to;Time   Memory: Decreased short-term memory   Safety/Judgement: Decreased awareness of safety   Problem Solving: Slow processing;Decreased initiation;Difficulty sequencing;Requires verbal cues;Requires tactile cues General Comments: Pt is very apraxic and has difficulty with one step commands even with tactile and verbal cues.  Perseverative about what year it is and thinks it is 2015 or 2016/knows she is at [facility].    Exercises Other Exercises Other Exercises: Seated at EOB with min assist (with bilat UE support from table to promote trunk ext) item idenfication with pt sliding items from the L side of the table to the R, improving L inattention and apraxia while also promoting movement sequencing, pt was unable to complete any movements independently but would intermittently hold and item and move  it with assistance, 1 x 10.       General Comments        Pertinent Vitals/Pain Pain Assessment: No/denies pain    Home Living                      Prior Function            PT Goals (current goals can now be found in the care plan section) Acute Rehab PT Goals Patient Stated Goal: Pt not able to state a goal this session PT Goal Formulation: With patient    Frequency  7X/week    PT Plan Current plan remains appropriate    Co-evaluation              End of Session Equipment Utilized During Treatment: Gait belt Activity Tolerance: Patient limited by fatigue Patient left: with call bell/phone within reach;in bed;with bed alarm set;with family/visitor present     Time: 1343-1411 PT Time Calculation (min) (ACUTE ONLY): 28 min  Charges:                       G CodesBernestine Amass, SPT 2014-11-21 3:23 PM

## 2014-11-10 NOTE — Progress Notes (Signed)
PT Cancellation Note  Patient Details Name: Allison Gonzalez MRN: [id number] DOB: 17-Oct-1929   Cancelled Treatment:    Reason Eval/Treat Not Completed: Patient at procedure or test/unavailable (Treatment session attempted; patient currently off unit for diagnostic testing.  Will re-attempt at later time/date as medically appropriate and available.)   Kimiyo Carmicheal H. Owens Shark, PT, DPT, NCS 11/10/2014, 10:11 AM [phone]

## 2014-11-10 NOTE — Plan of Care (Signed)
Problem: Discharge Progression Outcomes Goal: Discharge plan in place and appropriate Outcome: Progressing Individualization of Care Pt prefers to be called Allison Gonzalez Hx of breast CA, HTN, depression, anxiety, sleep apnea, hypotension, gout, hypercholesterolemia, fungal rash on buttocks   1. Discharge Plan: Neuro checks performed every 4 hours. 2. Pain: Patient on numeric scale this shift.  Patient is comfortable and without pain - rating pain 0/10. 3. Hemodynamically stable: VSS this shift. BP 105/51 mmHg  Pulse 81  Temp(Src) 97.4 F (36.3 C) (Oral)  Resp 18  Ht 5\' 8"  (1.727 m)  Wt 221 lb 5.5 oz (100.4 kg)  BMI 33.66 kg/m2  SpO2 97% 4. Complications: Patient complications improving gradually. She is now able to smile, answer simple questions and speak in brief sentences. She scored 16 on NIH stroke scale this shift.  Surgical, Vascular, Oncology and ID consults done on day shift.  Palliative consult with family to take place today 8/16. 5.  Diet:  Patient on Dys1 and thick liquid diet.  Pills given in pureed food this evening with success.  Patient did eat dinner this evening with 1:1 feed.  Tolerated well. 5. Activity: Patient is on bedrest and continues with significant left side weakness.

## 2014-11-10 NOTE — Progress Notes (Signed)
Initial Nutrition Assessment   INTERVENTION:   Meals and Snacks: Cater to patient preferences per SLP recommendations Medical Food Supplement Therapy: will recommend Mighty Shakes on meal trays TID and Magic Cup BID for added nutrition (each shake provides roughly 300kcals and 9g protein)   NUTRITION DIAGNOSIS:   Swallowing difficulty related to dysphagia as evidenced by  (SLP consult and current diet order).  GOAL:   Patient will meet greater than or equal to 90% of their needs  MONITOR:    (Energy Intake, Digestive system, Anthropometrics)  REASON FOR ASSESSMENT:   Malnutrition Screening Tool (Diet Order)    ASSESSMENT:   Pt admitted with AMS secondary to UTI with acute CVA, s/p SLP evaluation. Pt discharged 3 days prior from [facility] with UTI.  Past Medical History  Diagnosis Date  . Breast cancer 2015    positive w/ radiation  . Hypertension   . Depression   . Anxiety   . Sleep apnea   . Hypothyroidism   . Gout of right foot 10/21/14  . Gout of right foot   . Hypercholesterolemia 10/21/14  . Diabetes mellitus without complication     Diet Order:  DIET - DYS 1 Room service appropriate?: Yes; Fluid consistency:: Nectar Thick   Current Nutrition: Pt out of room this am on visit.   Food/Nutrition-Related History: Per MD note, pt with minimal po intake 3 days PTA, since last admission. RD notes last week pt reported not having a good appetite. Recorded po intake no 11/05/2014 was 40%.    Medications: NS at 53mL/hr, MVI, vitamin D  Electrolyte/Renal Profile and Glucose Profile:   Recent Labs Lab 11/08/14 0854 11/10/14 0522  NA 139 137  K 4.6 3.7  CL 102 103  CO2 26 25  BUN 19 14  CREATININE 0.95 0.73  CALCIUM 10.0 9.3  GLUCOSE 104* 109*   Protein Profile:  Recent Labs Lab 11/08/14 0854  ALBUMIN 3.6    Gastrointestinal Profile: Last BM: 11/10/2014  Skin:  Reviewed, no issues  Nutrition-Focused Physical Exam Findings:  Unable to complete  Nutrition-Focused physical exam at this time.  On 11/03/2014, pt Nutrition-Focused physical exam was WDL for fat and muscle depletion.   Weight Change: Pt with weight gain since last admission per [facility]   Height:   Ht Readings from Last 1 Encounters:  11/08/14 5\' 8"  (1.727 m)    Weight:   Wt Readings from Last 1 Encounters:  11/08/14 221 lb 5.5 oz (100.4 kg)    Wt Readings from Last 10 Encounters:  11/08/14 221 lb 5.5 oz (100.4 kg)  11/02/14 216 lb 1.6 oz (98.022 kg)  10/21/14 227 lb (102.967 kg)     Ideal Body Weight:   63.6kg  BMI:  Body mass index is 33.66 kg/(m^2).  Estimated Nutritional Needs:   Kcal:  1629-1925kcals, BEE: 1140kcals, TEE: (IF 1.1-1.3)(AF 1.3) using IBW of 63.6kg  Protein:  51-70g protein (0.8-1.0g/kg) using IBW of 63.6kg  Fluid:  1590-1980mLof fluid (25-90mL/kg) using IBW of 63.6kg  EDUCATION NEEDS:   Education needs no appropriate at this time  [place], RD, LDN Pager [phone]

## 2014-11-10 NOTE — Consult Note (Signed)
[place] @ [facility] [phone]) [phone]  Fax:([phone]) [place] OB: Aug 04, 1929  MR#: [id number]  VWP#:[id number]  Patient Care Team: Adrian Prows, MD as PCP - General (Infectious Diseases)  CHIEF COMPLAINT:  Chief Complaint  Patient presents with  . Recurrent UTI   1.  Admitted in the hospital   being unconscious for last few days.  MRI scan shows a right temporal mass Multiple area of infarct , VISIT DIAGNOSIS:     ICD-9-CM ICD-10-CM   1. Acute CVA (cerebrovascular accident) 434.91 I63.9 dexamethasone (DECADRON) injection 10 mg     levETIRAcetam (KEPPRA) tablet 750 mg  2. Sepsis, due to unspecified organism 038.9 A41.9    995.91    3. Complicated UTI (urinary tract infection) 599.0 N39.0   4. Unresponsive state 780.09 R40.4   5. Stroke 434.91 I63.9 US Carotid Bilateral     US Carotid Bilateral     dexamethasone (DECADRON) injection 10 mg     levETIRAcetam (KEPPRA) tablet 750 mg  6. Cancer 199.1 C80.1 CT Chest W Contrast     CT Chest W Contrast  7. Brain mass 348.9 G93.9 MR Brain W Contrast     MR Brain W Contrast     CANCELED: MR Brain W Contrast     CANCELED: MR Brain W Contrast     CANCELED: MR Brain W Wo Contrast     CANCELED: MR Brain W Wo Contrast      No history exists.    Oncology Flowsheet 11/02/2014 11/03/2014 11/04/2014 11/05/2014 11/09/2014 11/09/2014 11/10/2014  dexamethasone (DECADRON) IV - - - - 10 mg 10 mg 10 mg  enoxaparin (LOVENOX) [place] 40 mg 40 mg 40 mg - - - -  letrozole (FEMARA) PO - 2.5 mg - 2.5 mg - - -    INTERVAL HISTORY: Patient has been diagnosed to have carcinoma breast in February 6 of 2015 invasive mammary carcinoma and ductal carcinoma in situ ER positive PR positive initially was staged as T1b N0 M0 stage I disease patient had lumpectomy and radiation therapy and was started on letrozole.. Patient presented to emergency room on 14th of August with complaints of being unconscious for a few days. No  witnessed seizure activity.  Patient is very poor historian.  So most of the history has been off pain reviewing chart.  There was some mention about patient getting ECT on July 24 and has been canceled. Patient does not remember actually what happened to her.  Trying to talk but not able to express herself.  Patient underwent CT scan of chest abdomen pelvis also MRI scan of brain.  Also had down Doppler study of both carotid artery but left carotid artery was not visualized. CT scan and MRI scan revealed multiple area of subacute infarct. MRI scan revealed right temporal lobe mass I was asked to evaluate patient at that point in time. IMPRESSION: Solitary 34 x 50 x 40 mm intra-axial RIGHT posterior temporal mass lesion, avidly enhancing, with areas of central necrosis.  Solitary breast cancer metastasis is favored. Other intra-axial tumors, such as GBM or lymphoma are less likely. The imaging appearance is not strongly suggestive of brain abscess, but cannot completely be excluded given the heterogeneous diffusion restriction on prior non con MR, and the presence of Gram positive cocci bacteremia. Tissue sampling may be warranted.  REVIEW OF SYSTEMS:   Patient should not is poor historian.  Most of the history has been reviewed from patient's multiple admission chart and  my old records.  As per HPI. Otherwise, a complete review of systems is negatve.  PAST MEDICAL HISTORY: Past Medical History  Diagnosis Date  . Breast cancer 2015    positive w/ radiation  . Hypertension   . Depression   . Anxiety   . Sleep apnea   . Hypothyroidism   . Gout of right foot 10/21/14  . Gout of right foot   . Hypercholesterolemia 10/21/14  . Diabetes mellitus without complication     PAST SURGICAL HISTORY: Past Surgical History  Procedure Laterality Date  . Eye surgery    . Breast surgery    . Hernia repair Right 10/21/14    FAMILY HISTORY Family History  Problem Relation Age of Onset  .  Hypertension Son   . CVA Mother   . Heart disease Father        ADVANCED DIRECTIVES:   Not available, I will discuss with the family HEALTH MAINTENANCE: Social History  Substance Use Topics  . Smoking status: Former Smoker    Types: Cigarettes    Quit date: 10/21/1974  . Smokeless tobacco: None  . Alcohol Use: No      Allergies  Allergen Reactions  . Penicillins Rash    Current Facility-Administered Medications  Medication Dose Route Frequency Provider Last Rate Last Dose  . 0.9 %  sodium chloride infusion   Intravenous Continuous Demetrios Loll, MD 50 mL/hr at 11/09/14 1857    . allopurinol (ZYLOPRIM) tablet 300 mg  300 mg Oral Daily Max Sane, MD   300 mg at 11/10/14 1110  . antiseptic oral rinse (CPC / CETYLPYRIDINIUM CHLORIDE 0.05%) solution 7 mL  7 mL Mouth Rinse BID Vela Prose Pardini, RN   7 mL at 11/10/14 1111  . aspirin suppository 300 mg  300 mg Rectal Daily Demetrios Loll, MD   300 mg at 11/10/14 1110  . atorvastatin (LIPITOR) tablet 10 mg  10 mg Oral q1800 Max Sane, MD   10 mg at 11/09/14 1800  . cefTRIAXone (ROCEPHIN) 1 g in dextrose 5 % 50 mL IVPB  1 g Intravenous Q24H Adrian Prows, MD   1 g at 11/09/14 2313  . cholecalciferol (VITAMIN D) tablet 2,000 Units  2,000 Units Oral Daily Max Sane, MD   2,000 Units at 11/10/14 1110  . dexamethasone (DECADRON) injection 10 mg  10 mg Intravenous Q12H Forest Gleason, MD   10 mg at 11/10/14 1111  . docusate sodium (COLACE) capsule 100 mg  100 mg Oral BID PRN Max Sane, MD      . FLUoxetine (PROZAC) capsule 40 mg  40 mg Oral Daily Vipul Shah, MD   40 mg at 11/10/14 1110  . heparin injection 5,000 Units  5,000 Units Subcutaneous 3 times per day Demetrios Loll, MD   5,000 Units at 11/10/14 1335  . levETIRAcetam (KEPPRA) tablet 750 mg  750 mg Oral BID Forest Gleason, MD   750 mg at 11/10/14 1110  . levothyroxine (SYNTHROID, LEVOTHROID) tablet 200 mcg  200 mcg Oral QAC breakfast Max Sane, MD   200 mcg at 11/10/14 0731  . losartan (COZAAR)  tablet 25 mg  25 mg Oral Daily Max Sane, MD   25 mg at 11/10/14 1110  . multivitamin with minerals tablet 1 tablet  1 tablet Oral Daily Max Sane, MD   1 tablet at 11/10/14 1110  . pantoprazole (PROTONIX) EC tablet 40 mg  40 mg Oral QHS Max Sane, MD   40 mg at 11/09/14 2314  . senna-docusate (  Senokot-S) tablet 1 tablet  1 tablet Oral QHS PRN Shaune Pollack, MD      . vancomycin (VANCOCIN) 1,250 mg in sodium chloride 0.9 % 250 mL IVPB  1,250 mg Intravenous Q18H Delfino Lovett, MD   1,250 mg at 11/10/14 1335    OBJECTIVE: PHYSICAL EXAM Patient is lying in the bed.  Has increasing difficulty with expressing herself. Dragon normal limit. Right breast shows some induration at the site of the previous lumpectomy. Neurological system difficult to evaluate.  Some weakness on the right side Speech is impaired Abdominal exam revealed normal bowel sounds. The abdomen was soft, non-tender, and without masses, organomegaly, or appreciable enlargement of the abdominal aorta. Examination of the skin revealed no evidence of significant rashes, suspicious appearing nevi or other concerning lesions. Cardiac exam revealed the PMI to be normally situated and sized. The rhythm was regular and no extrasystoles were noted during several minutes of auscultation. The first and second heart sounds were normal and physiologic splitting of the second heart sound was noted. There were no murmurs, rubs, clicks, or gallops. Examination of the chest was unremarkable. There were no bony deformities, no asymmetry, and no other abnormalities. Lower extremity no edema,  Filed Vitals:   11/10/14 1511  BP: 113/58  Pulse: 82  Temp: 97.9 F (36.6 C)  Resp:      Body mass index is 33.66 kg/(m^2).    ECOG FS:2 - Symptomatic, <50% confined to bed  LAB RESULTS:  Admission on 11/08/2014  Component Date Value Ref Range Status  . Lactic Acid, Venous 11/08/2014 1.3  0.5 - 2.0 mmol/L Final  . Sodium 11/08/2014 139  135 - 145 mmol/L  Final  . Potassium 11/08/2014 4.6  3.5 - 5.1 mmol/L Final  . Chloride 11/08/2014 102  101 - 111 mmol/L Final  . CO2 11/08/2014 26  22 - 32 mmol/L Final  . Glucose, Bld 11/08/2014 104* 65 - 99 mg/dL Final  . BUN 51/20/4737 19  6 - 20 mg/dL Final  . Creatinine, Ser 11/08/2014 0.95  0.44 - 1.00 mg/dL Final  . Calcium 25/66/9050 10.0  8.9 - 10.3 mg/dL Final  . Total Protein 11/08/2014 7.6  6.5 - 8.1 g/dL Final  . Albumin 39/97/1373 3.6  3.5 - 5.0 g/dL Final  . AST 45/63/4617 38  15 - 41 U/L Final  . ALT 11/08/2014 23  14 - 54 U/L Final  . Alkaline Phosphatase 11/08/2014 122  38 - 126 U/L Final  . Total Bilirubin 11/08/2014 0.4  0.3 - 1.2 mg/dL Final  . GFR calc non Af Amer 11/08/2014 54* >60 mL/min Final  . GFR calc Af Amer 11/08/2014 >60  >60 mL/min Final   Comment: (NOTE) The eGFR has been calculated using the CKD EPI equation. This calculation has not been validated in all clinical situations. eGFR's persistently <60 mL/min signify possible Chronic Kidney Disease.   . Anion gap 11/08/2014 11  5 - 15 Final  . Lipase 11/08/2014 14* 22 - 51 U/L Final  . Troponin I 11/08/2014 <0.03  <0.031 ng/mL Final   Comment:        NO INDICATION OF MYOCARDIAL INJURY.   . WBC 11/08/2014 15.5* 3.6 - 11.0 K/uL Final  . RBC 11/08/2014 5.03  3.80 - 5.20 MIL/uL Final  . Hemoglobin 11/08/2014 14.5  12.0 - 16.0 g/dL Final  . HCT 18/54/1531 45.5  35.0 - 47.0 % Final  . MCV 11/08/2014 90.5  80.0 - 100.0 fL Final  . MCH 11/08/2014 28.9  26.0 - 34.0 pg Final  . MCHC 11/08/2014 31.9* 32.0 - 36.0 g/dL Final  . RDW 11/08/2014 15.7* 11.5 - 14.5 % Final  . Platelets 11/08/2014 269  150 - 440 K/uL Final  . Neutrophils Relative % 11/08/2014 68   Final  . Neutro Abs 11/08/2014 10.6* 1.4 - 6.5 K/uL Final  . Lymphocytes Relative 11/08/2014 24   Final  . Lymphs Abs 11/08/2014 3.6  1.0 - 3.6 K/uL Final  . Monocytes Relative 11/08/2014 6   Final  . Monocytes Absolute 11/08/2014 0.9  0.2 - 0.9 K/uL Final  .  Eosinophils Relative 11/08/2014 1   Final  . Eosinophils Absolute 11/08/2014 0.1  0 - 0.7 K/uL Final  . Basophils Relative 11/08/2014 1   Final  . Basophils Absolute 11/08/2014 0.1  0 - 0.1 K/uL Final  . aPTT 11/08/2014 29  24 - 36 seconds Final  . Prothrombin Time 11/08/2014 14.2  11.4 - 15.0 seconds Final  . INR 11/08/2014 1.08   Final  . Specimen Description 11/08/2014 BLOOD RIGHT ARM   Final  . Special Requests 11/08/2014 BOTTLES DRAWN AEROBIC AND ANAEROBIC  AER 5CC ANA 2CC   Final  . Culture  Setup Time 11/08/2014    Final                   Value:GRAM POSITIVE COCCI IN CLUSTERS IN BOTH AEROBIC AND ANAEROBIC BOTTLES CRITICAL RESULT CALLED TO, READ BACK BY AND VERIFIED WITH: ERIN PARDINI AT 0820 11/09/14 CTJ CRITICAL VALUE NOTED.  VALUE IS CONSISTENT WITH PREVIOUSLY REPORTED AND CALLED VALUE.   . Culture 11/08/2014    Final                   Value:COAGULASE NEGATIVE STAPHYLOCOCCUS IN BOTH AEROBIC AND ANAEROBIC BOTTLES IDENTIFICATION TO FOLLOW   . Report Status 11/08/2014 PENDING   Incomplete  . Specimen Description 11/08/2014 BLOOD LEFT FATTY CASTS   Final  . Special Requests 11/08/2014 BOTTLES DRAWN AEROBIC AND ANAEROBIC  AER 4CC ANA 1CC   Final  . Culture  Setup Time 11/08/2014    Final                   Value:GRAM POSITIVE COCCI IN CLUSTERS AEROBIC BOTTLE ONLY CRITICAL RESULT CALLED TO, READ BACK BY AND VERIFIED WITH: ERIN PARDINI AT 0820 11/09/14 CTJ   . Culture 11/08/2014    Final                   Value:COAGULASE NEGATIVE STAPHYLOCOCCUS AEROBIC BOTTLE ONLY IDENTIFICATION TO FOLLOW   . Report Status 11/08/2014 PENDING   Incomplete  . Color, Urine 11/08/2014 AMBER* YELLOW Final  . APPearance 11/08/2014 CLOUDY* CLEAR Final  . Glucose, UA 11/08/2014 NEGATIVE  NEGATIVE mg/dL Final  . Bilirubin Urine 11/08/2014 NEGATIVE  NEGATIVE Final  . Ketones, ur 11/08/2014 TRACE* NEGATIVE mg/dL Final  . Specific Gravity, Urine 11/08/2014 1.025  1.005 - 1.030 Final  . Hgb urine dipstick  11/08/2014 3+* NEGATIVE Final  . pH 11/08/2014 5.0  5.0 - 8.0 Final  . Protein, ur 11/08/2014 30* NEGATIVE mg/dL Final  . Nitrite 11/08/2014 NEGATIVE  NEGATIVE Final  . Leukocytes, UA 11/08/2014 2+* NEGATIVE Final  . RBC / HPF 11/08/2014 TOO NUMEROUS TO COUNT  0 - 5 RBC/hpf Final  . WBC, UA 11/08/2014 TOO NUMEROUS TO COUNT  0 - 5 WBC/hpf Final  . Bacteria, UA 11/08/2014 RARE* NONE SEEN Final  . Squamous Epithelial / LPF 11/08/2014 0-5* NONE SEEN Final  .  WBC Clumps 11/08/2014 PRESENT   Final  . Mucous 11/08/2014 PRESENT   Final  . Specimen Description 11/08/2014 URINE, RANDOM   Final  . Special Requests 11/08/2014 Normal   Final  . Culture 11/08/2014 MULTIPLE SPECIES PRESENT, SUGGEST RECOLLECTION   Final  . Report Status 11/08/2014 11/09/2014 FINAL   Final  . Lactic Acid, Venous 11/08/2014 1.1  0.5 - 2.0 mmol/L Final  . Hgb A1c MFr Bld 11/09/2014 5.3  4.0 - 6.0 % Final  . Cholesterol 11/09/2014 119  0 - 200 mg/dL Final  . Triglycerides 11/09/2014 119  <150 mg/dL Final  . HDL 11/09/2014 33* >40 mg/dL Final  . Total CHOL/HDL Ratio 11/09/2014 3.6   Final  . VLDL 11/09/2014 24  0 - 40 mg/dL Final  . LDL Cholesterol 11/09/2014 62  0 - 99 mg/dL Final   Comment:        Total Cholesterol/HDL:CHD Risk Coronary Heart Disease Risk Table                     Men   Women  1/2 Average Risk   3.4   3.3  Average Risk       5.0   4.4  2 X Average Risk   9.6   7.1  3 X Average Risk  23.4   11.0        Use the calculated Patient Ratio above and the CHD Risk Table to determine the patient's CHD Risk.        ATP III CLASSIFICATION (LDL):  <100     mg/dL   Optimal  100-129  mg/dL   Near or Above                    Optimal  130-159  mg/dL   Borderline  160-189  mg/dL   High  >190     mg/dL   Very High   . WBC 11/10/2014 7.2  3.6 - 11.0 K/uL Final  . RBC 11/10/2014 4.40  3.80 - 5.20 MIL/uL Final  . Hemoglobin 11/10/2014 13.0  12.0 - 16.0 g/dL Final  . HCT 11/10/2014 39.7  35.0 - 47.0 % Final    . MCV 11/10/2014 90.3  80.0 - 100.0 fL Final  . MCH 11/10/2014 29.5  26.0 - 34.0 pg Final  . MCHC 11/10/2014 32.7  32.0 - 36.0 g/dL Final  . RDW 11/10/2014 15.5* 11.5 - 14.5 % Final  . Platelets 11/10/2014 212  150 - 440 K/uL Final  . Sodium 11/10/2014 137  135 - 145 mmol/L Final  . Potassium 11/10/2014 3.7  3.5 - 5.1 mmol/L Final  . Chloride 11/10/2014 103  101 - 111 mmol/L Final  . CO2 11/10/2014 25  22 - 32 mmol/L Final  . Glucose, Bld 11/10/2014 109* 65 - 99 mg/dL Final  . BUN 11/10/2014 14  6 - 20 mg/dL Final  . Creatinine, Ser 11/10/2014 0.73  0.44 - 1.00 mg/dL Final  . Calcium 11/10/2014 9.3  8.9 - 10.3 mg/dL Final  . GFR calc non Af Amer 11/10/2014 >60  >60 mL/min Final  . GFR calc Af Amer 11/10/2014 >60  >60 mL/min Final   Comment: (NOTE) The eGFR has been calculated using the CKD EPI equation. This calculation has not been validated in all clinical situations. eGFR's persistently <60 mL/min signify possible Chronic Kidney Disease.   . Anion gap 11/10/2014 9  5 - 15 Final  . Specimen Description 11/09/2014 BLOOD LEFT HAND   Final  .  Special Requests 11/09/2014 BOTTLES DRAWN AEROBIC AND ANAEROBIC 5MLANAEROBIC, 8MLAEROBIC   Final  . Culture 11/09/2014 NO GROWTH < 12 HOURS   Final  . Report Status 11/09/2014 PENDING   Incomplete  . Specimen Description 11/09/2014 BLOOD RIGHT HAND   Final  . Special Requests 11/09/2014 BOTTLES DRAWN AEROBIC AND ANAEROBIC 5ML   Final  . Culture 11/09/2014 NO GROWTH < 12 HOURS   Final  . Report Status 11/09/2014 PENDING   Incomplete     STUDIES: Dg Chest 2 View  11/02/2014   CLINICAL DATA:  Dizziness, evaluate for pneumonia  EXAM: CHEST  2 VIEW  COMPARISON:  02/04/2013  FINDINGS: There is no focal parenchymal opacity. There is no pleural effusion or pneumothorax. There is mild stable cardiomegaly.  There is bilateral glenohumeral osteoarthritis.  IMPRESSION: No active cardiopulmonary disease.   Electronically Signed   By: Kathreen Devoid   On:  11/02/2014 17:49   Ct Head Wo Contrast  11/08/2014   CLINICAL DATA:  Head injury and loss of consciousness after fall.  EXAM: CT HEAD WITHOUT CONTRAST  CT CERVICAL SPINE WITHOUT CONTRAST  TECHNIQUE: Multidetector CT imaging of the head and cervical spine was performed following the standard protocol without intravenous contrast. Multiplanar CT image reconstructions of the cervical spine were also generated.  COMPARISON:  None.  FINDINGS: CT HEAD FINDINGS  Bony calvarium appears intact. Sulcal effacement and large area of low density is seen involving the right posterior parietal cortex concerning for acute infarction. No midline shift is noted. Ventricular size is within normal limits. No hemorrhage is noted.  CT CERVICAL SPINE FINDINGS  Reversal of normal lordosis of cervical spine is noted, most likely due to severe degenerative disc disease present at C4-5, C5-6 and C6-7. Moderate degenerative disc disease is also present at C3-4. No fracture or spondylolisthesis is noted. Mild bilateral posterior facet joint hypertrophy is noted in the upper cervical spine. Visualized lung apices appear normal.  IMPRESSION: Probable large area of acute infarction involving the right posterior parietal cortex. MRI may be performed for further evaluation.  Severe multilevel degenerative disc disease is noted in the cervical spine. No fracture or spondylolisthesis is noted.   Electronically Signed   By: Marijo Conception, M.D.   On: 11/08/2014 10:32   Ct Chest W Contrast  11/10/2014   CLINICAL DATA:  Altered mental status. Brain and breast masses. Bacteremia.  EXAM: CT CHEST WITH CONTRAST  TECHNIQUE: Multidetector CT imaging of the chest was performed during intravenous contrast administration.  CONTRAST:  1mL OMNIPAQUE IOHEXOL 300 MG/ML  SOLN  COMPARISON:  Chest radiograph of 11/08/2014.  No prior chest CT.  FINDINGS: Mediastinum/Nodes: Exclusion of the previously described right breast Mass. No axillary adenopathy. Tortuous  thoracic aorta. Mild dilatation of the ascending segment at maximally 4.1 cm. Example coronal image 58. Moderate cardiomegaly with mild lipomatous hypertrophy of the interatrial septum. Multivessel coronary artery atherosclerosis. Pulmonary artery enlargement. No central pulmonary embolism, on this non-dedicated study. No mediastinal or hilar adenopathy. A small hiatal hernia. No internal mammary adenopathy.  Lungs/Pleura: Mild degradation secondary to motion and patient arm position, not raised above the head. Mild bilateral pleural thickening, without significant pleural fluid.  No dominant pulmonary mass or lobar consolidation.  Upper abdomen: Normal imaged portions of the liver, spleen, adrenal glands. Mild renal cortical thinning. Upper pole left renal low-density lesion at 1.8 cm. Twenty-five HU today versus 17 HU on 11/08/2014. Abdominal aortic and branch vessel atherosclerosis. Early contrast excretion into both renal collecting  systems.  Musculoskeletal: Advanced bilateral glenohumeral joint osteoarthritis. Heterogeneous marrow density, primarily felt to be due to osteopenia. Mild T5 superior endplate irregularity and sclerosis.  IMPRESSION: 1. Position and motion degraded exam. 2. Given this factor, no evidence of primary neoplasm, metastatic disease, or infection within the chest. 3. Aortic tortuosity with mild dilatation of the ascending aorta. Recommend annual imaging followup by CTA or MRA. This recommendation follows 2010 ACCF/AHA/AATS/ACR/ASA/SCA/SCAI/SIR/STS/SVM Guidelines for the Diagnosis and Management of Patients with Thoracic Aortic Disease. Circulation. 2010; 121: L937-T024 4. Pulmonary artery enlargement suggests pulmonary arterial hypertension. 5.  Atherosclerosis, including within the coronary arteries. 6. Upper pole left renal lesion which is technically indeterminate. Most likely a minimally complex cyst. Given the dominant brain mass, this is of questionable clinical significance.  Ultrasound followup at 6 months could be performed to confirm size stability.   Electronically Signed   By: Abigail Miyamoto M.D.   On: 11/10/2014 12:33   Ct Cervical Spine Wo Contrast  11/08/2014   CLINICAL DATA:  Head injury and loss of consciousness after fall.  EXAM: CT HEAD WITHOUT CONTRAST  CT CERVICAL SPINE WITHOUT CONTRAST  TECHNIQUE: Multidetector CT imaging of the head and cervical spine was performed following the standard protocol without intravenous contrast. Multiplanar CT image reconstructions of the cervical spine were also generated.  COMPARISON:  None.  FINDINGS: CT HEAD FINDINGS  Bony calvarium appears intact. Sulcal effacement and large area of low density is seen involving the right posterior parietal cortex concerning for acute infarction. No midline shift is noted. Ventricular size is within normal limits. No hemorrhage is noted.  CT CERVICAL SPINE FINDINGS  Reversal of normal lordosis of cervical spine is noted, most likely due to severe degenerative disc disease present at C4-5, C5-6 and C6-7. Moderate degenerative disc disease is also present at C3-4. No fracture or spondylolisthesis is noted. Mild bilateral posterior facet joint hypertrophy is noted in the upper cervical spine. Visualized lung apices appear normal.  IMPRESSION: Probable large area of acute infarction involving the right posterior parietal cortex. MRI may be performed for further evaluation.  Severe multilevel degenerative disc disease is noted in the cervical spine. No fracture or spondylolisthesis is noted.   Electronically Signed   By: Marijo Conception, M.D.   On: 11/08/2014 10:32   Mr Brain Wo Contrast  11/08/2014   CLINICAL DATA:  Found unresponsive 3 days ago. Concern for acute stroke. History of right breast cancer.  EXAM: MRI HEAD WITHOUT CONTRAST  MRA HEAD WITHOUT CONTRAST  TECHNIQUE: Multiplanar, multiecho pulse sequences of the brain and surrounding structures were obtained without intravenous contrast.  Angiographic images of the head were obtained using MRA technique without contrast.  COMPARISON:  Head CT 11/08/2014  FINDINGS: MRI HEAD FINDINGS  There is cortically based, mildly restricted diffusion involving the posterior right insula and anterior right parietal lobe without definite corresponding T2 signal abnormality. There is a heterogeneously T2 hyperintense right posterior temporal intra-axial mass measuring 4.7 x 3.8 cm with heterogeneous areas of internal restricted diffusion. There is moderate surrounding vasogenic edema in the temporal, occipital, and parietal lobes with mass effect on the temporal and occipital horns of the right lateral ventricle. There is no midline shift.  There is mild-to-moderate cerebral atrophy, particularly in the left perisylvian region. No intracranial hemorrhage or extra-axial fluid collection is seen.  Prior bilateral cataract extraction is noted. Minimal posterior left ethmoid air cell mucosal thickening is present. Mastoid air cells are clear. Major intracranial vascular flow voids are preserved.  MRA HEAD FINDINGS  The study is mildly motion degraded.  Visualized distal vertebral arteries are patent with the left being slightly dominant. Evaluation of the proximal to mid V4 segments is partly limited due to technical factors with decreased signal in this region. There is suggestion of a moderate to severe proximal left V4 stenosis. PICA origins are not clearly identified. Right AICA and bilateral SCA origins are patent. Basilar artery is patent without stenosis. PCA origins are patent. There is no significant proximal PCA stenosis, although there may be mild to moderate mid right P2 stenosis. The left P1 segment is patent without significant stenosis, however there is an apparent severe stenosis/ short segment occlusion of the proximal left P2 segment with reconstitution distally. Left PCA branch vessels distal the this are attenuated.  The intracranial right ICA is  patent with mild supraclinoid stenosis. Intracranial left ICA appears mildly small in caliber diffusely compared to the right which may be developmental as the left A1 segment appears to be either absent or severely hypoplastic. There is diminished flow related enhancement in the distal left carotid siphon which progressively decreases with only a very small amount of signal at the carotid terminus and into the left MCA, suggestive of a severe supraclinoid ICA stenosis. The left M1 segment is grossly patent although poorly evaluated given the decreased signal. There are multiple patent left M2 branches in the sylvian fissure. Right M1 and proximal M2 segments are patent without significant stenosis. There is mild distal right MCA branch vessel irregularity. Right A1 segment is patent without stenosis. Anterior communicating artery is patent. Right ACA supplies the left A2.  IMPRESSION: 1. 4.7 cm posterior right temporal lobe mass with moderate surrounding edema. Considerations include metastasis from patient's known breast cancer or primary CNS neoplasm. Further evaluation with post-contrast brain MRI is recommended. 2. Mildly restricted diffusion involving posterior right insula and anterior right parietal cortex, which could reflect a small acute infarct or sequelae of recent seizure activity. 3. Mildly degraded head MRA. Decreased flow related enhancement in the distal left ICA and proximal left MCA concerning for high-grade, flow reducing proximal left supraclinoid ICA stenosis. 4. Suspected moderate to severe proximal left V4 vertebral artery stenosis. 5. Severe stenosis or short segment occlusion of the proximal left P2 segment.   Electronically Signed   By: Logan Bores   On: 11/08/2014 17:35   Mr Brain W Contrast  11/10/2014   CLINICAL DATA:  Breast cancer.  Seizures.  EXAM: MRI HEAD WITH CONTRAST  TECHNIQUE: Multiplanar, multiecho pulse sequences of the brain and surrounding structures were obtained with  intravenous contrast.  COMPARISON:  None.  CONTRAST:  5mL MULTIHANCE GADOBENATE DIMEGLUMINE 529 MG/ML IV SOLN  FINDINGS: Post infusion, there is a solitary intra-axial mass lesion, RIGHT posterior temporal lobe, measuring 34 x 50 x 40 mm cross-section (R-L x A-P x C-C). Marked surrounding vasogenic edema. The mass is sufficiently large as to be both RIGHT atrial periventricular as well as abutting the dura laterally and transverse sinus inferiorly. There are variable degrees of central necrosis but overall the mass shows avid enhancement. No other similar lesions are seen in the brain on this motion degraded scan. Despite moderate edema, no significant midline shift.  IMPRESSION: Solitary 34 x 50 x 40 mm intra-axial RIGHT posterior temporal mass lesion, avidly enhancing, with areas of central necrosis.  Solitary breast cancer metastasis is favored. Other intra-axial tumors, such as GBM or lymphoma are less likely. The imaging appearance is not strongly suggestive of  brain abscess, but cannot completely be excluded given the heterogeneous diffusion restriction on prior non con MR, and the presence of Gram positive cocci bacteremia. Tissue sampling may be warranted.   Electronically Signed   By: Staci Righter M.D.   On: 11/10/2014 11:13   Ct Abdomen Pelvis W Contrast  11/08/2014   CLINICAL DATA:  81 year old female found unresponsive. Sepsis. Recent urinary tract infection. Initial encounter.  EXAM: CT ABDOMEN AND PELVIS WITH CONTRAST  TECHNIQUE: Multidetector CT imaging of the abdomen and pelvis was performed using the standard protocol following bolus administration of intravenous contrast.  CONTRAST:  142mL OMNIPAQUE IOHEXOL 350 MG/ML SOLN  COMPARISON:  Lumbar MRI 02/01/2013.  FINDINGS: Cardiomegaly. No pericardial effusion. Bilateral lung base scarring or atelectasis. No pleural effusion or lung base consolidation.  Advanced degenerative changes in the spine. Osteopenia. No acute osseous abnormality  identified.  No pelvic free fluid. Negative uterus and adnexa. Negative rectum aside from mild retained stool.  The bladder is mildly distended and there is perivesical stranding (series 2, image 76). There is a 2-3 cm diverticulum at the bladder dome (series 2, image 69 and sagittal image 87). Negative distal left ureters. No gas within the bladder.  Large body habitus and laxity of the ventral abdominal wall. Redundant sigmoid colon. Proximal sigmoid diverticulosis but no active inflammation. Negative left colon. Negative transverse colon. Redundant right colon. The entire right lateral abdomen wall is not included due to large body habitus. No large bowel inflammation identified. Negative terminal ileum. Appendix not identified. No dilated small bowel. No ventral abdominal hernia identified. Postoperative changes to the right ventral abdominal wall. Negative stomach.  Liver, spleen, pancreas (atrophied), and adrenal glands are within normal limits. The gallbladder appears to be surgically absent. The portal venous system is patent. Extensive Aortoiliac calcified atherosclerosis noted. Major arterial structures are patent. No abdominal free fluid. No lymphadenopathy. Nonobstructing right midpole 11 mm calculus. Otherwise normal for age kidneys. Renal contrast enhancement and excretion is symmetric.  There is a 4.5 cm round and spiculated intermediate density (40 Hounsfield units) right breast mass. See coronal image 83.  IMPRESSION: 1. Bladder wall inflammation compatible with urinary infection. 2-3 cm bladder fundus diverticulum. Kidneys are negative aside from nonobstructing right nephrolithiasis. 2. 4.5 cm right breast mass. Recommend mammographic correlation once the patient recovers from the acute illness. 3. No other acute or inflammatory process in the abdomen or pelvis. 4. Extensive calcified aortic atherosclerosis. Extensive spine degeneration.   Electronically Signed   By: Genevie Ann M.D.   On: 11/08/2014  11:52   US Carotid Bilateral  11/08/2014   CLINICAL DATA:  Unresponsive, right temporal mass  EXAM: BILATERAL CAROTID DUPLEX ULTRASOUND  TECHNIQUE: Pearline Cables scale imaging, color Doppler and duplex ultrasound were performed of bilateral carotid and vertebral arteries in the neck. The examination is limited is a patient did not complete the left half of the study.  COMPARISON:  None.  FINDINGS: Criteria: Quantification of carotid stenosis is based on velocity parameters that correlate the residual internal carotid diameter with NASCET-based stenosis levels, using the diameter of the distal internal carotid lumen as the denominator for stenosis measurement.  The following velocity measurements were obtained:  RIGHT  ICA:  176/44 cm/sec  CCA:  76/19 cm/sec  SYSTOLIC ICA/CCA RATIO:  3.2  DIASTOLIC ICA/CCA RATIO:  2.9  ECA:  248 cm/sec  LEFT  Not completed at the patient's wishes  RIGHT CAROTID ARTERY: Diffuse atherosclerotic plaque is noted in the region of the carotid bulb  and proximal internal carotid artery. The waveforms, velocities and flow velocity ratios suggest a stenosis in the 50-69% range.  RIGHT VERTEBRAL ARTERY:  Antegrade in nature.  IMPRESSION: 50-69% stenosis in the right proximal internal carotid artery.  Patient refused further imaging and the left carotid artery was not interrogated.   Electronically Signed   By: Inez Catalina M.D.   On: 11/08/2014 18:41   Dg Chest Port 1 View  11/08/2014   CLINICAL DATA:  Sepsis.  EXAM: PORTABLE CHEST - 1 VIEW  COMPARISON:  November 02, 2014.  FINDINGS: Stable cardiomegaly. No pneumothorax or significant pleural effusion is noted. Right lung is clear. Mild left basilar opacity is noted concerning for subsegmental atelectasis or scarring. Severe degenerative changes seen involving the glenohumeral joints bilaterally.  IMPRESSION: Mild left basilar subsegmental atelectasis or scarring. Stable cardiomegaly.   Electronically Signed   By: Marijo Conception, M.D.   On:  11/08/2014 09:58   Mr Jodene Nam Head/brain Wo Cm  11/08/2014   CLINICAL DATA:  Found unresponsive 3 days ago. Concern for acute stroke. History of right breast cancer.  EXAM: MRI HEAD WITHOUT CONTRAST  MRA HEAD WITHOUT CONTRAST  TECHNIQUE: Multiplanar, multiecho pulse sequences of the brain and surrounding structures were obtained without intravenous contrast. Angiographic images of the head were obtained using MRA technique without contrast.  COMPARISON:  Head CT 11/08/2014  FINDINGS: MRI HEAD FINDINGS  There is cortically based, mildly restricted diffusion involving the posterior right insula and anterior right parietal lobe without definite corresponding T2 signal abnormality. There is a heterogeneously T2 hyperintense right posterior temporal intra-axial mass measuring 4.7 x 3.8 cm with heterogeneous areas of internal restricted diffusion. There is moderate surrounding vasogenic edema in the temporal, occipital, and parietal lobes with mass effect on the temporal and occipital horns of the right lateral ventricle. There is no midline shift.  There is mild-to-moderate cerebral atrophy, particularly in the left perisylvian region. No intracranial hemorrhage or extra-axial fluid collection is seen.  Prior bilateral cataract extraction is noted. Minimal posterior left ethmoid air cell mucosal thickening is present. Mastoid air cells are clear. Major intracranial vascular flow voids are preserved.  MRA HEAD FINDINGS  The study is mildly motion degraded.  Visualized distal vertebral arteries are patent with the left being slightly dominant. Evaluation of the proximal to mid V4 segments is partly limited due to technical factors with decreased signal in this region. There is suggestion of a moderate to severe proximal left V4 stenosis. PICA origins are not clearly identified. Right AICA and bilateral SCA origins are patent. Basilar artery is patent without stenosis. PCA origins are patent. There is no significant proximal  PCA stenosis, although there may be mild to moderate mid right P2 stenosis. The left P1 segment is patent without significant stenosis, however there is an apparent severe stenosis/ short segment occlusion of the proximal left P2 segment with reconstitution distally. Left PCA branch vessels distal the this are attenuated.  The intracranial right ICA is patent with mild supraclinoid stenosis. Intracranial left ICA appears mildly small in caliber diffusely compared to the right which may be developmental as the left A1 segment appears to be either absent or severely hypoplastic. There is diminished flow related enhancement in the distal left carotid siphon which progressively decreases with only a very small amount of signal at the carotid terminus and into the left MCA, suggestive of a severe supraclinoid ICA stenosis. The left M1 segment is grossly patent although poorly evaluated given the decreased signal.  There are multiple patent left M2 branches in the sylvian fissure. Right M1 and proximal M2 segments are patent without significant stenosis. There is mild distal right MCA branch vessel irregularity. Right A1 segment is patent without stenosis. Anterior communicating artery is patent. Right ACA supplies the left A2.  IMPRESSION: 1. 4.7 cm posterior right temporal lobe mass with moderate surrounding edema. Considerations include metastasis from patient's known breast cancer or primary CNS neoplasm. Further evaluation with post-contrast brain MRI is recommended. 2. Mildly restricted diffusion involving posterior right insula and anterior right parietal cortex, which could reflect a small acute infarct or sequelae of recent seizure activity. 3. Mildly degraded head MRA. Decreased flow related enhancement in the distal left ICA and proximal left MCA concerning for high-grade, flow reducing proximal left supraclinoid ICA stenosis. 4. Suspected moderate to severe proximal left V4 vertebral artery stenosis. 5. Severe  stenosis or short segment occlusion of the proximal left P2 segment.   Electronically Signed   By: Logan Bores   On: 11/08/2014 17:35    ASSESSMENT:  4.7 cm mass in the right temporal area with peripheral edema suggestive of either metastases or primary brain tumor. Patient certainly has a stage I breast cancer and likely to metastatic ties to the brain is very low Will start patient on now steroid Possibility of underlying seizure activity so will be started on Keppra Patient has multiple other medical problems like UTI sepsis Bipolar disease.  Previous history of sleep apnea, gout, thyroidism. 2.  We are awaiting for neurological evaluation by a neurologist regarding patient's history of carotid artery disease and multiple subacute infarct I discussed situation with Dr. Manuella Ghazi, her present attending in the hospital regarding either possibility of radiation therapy versus neurosurgical approach I will also discussed situation today with her son about overall expectation Palliative care physician also we will reevaluate patient I discussed also her care with palliative care physicians Dr. Megan Salon. After discussing with the family and radiation oncologist will formulate plan for the treatment Physiotherapy is in progress  Patient also has 4.5 cm mass in the breast which will be evaluated if needed.   November 10, 2014 On examination patient did not have any significant neurological improvement.  On steroids and Keppra.  Neurological input is being appreciated. I had prolonged discussion with patient's 2 children in [place] and one son here. Discussion lasted more than 45 minutes Finally I said my recommendation would be 1.  Either neurosurgical evaluation or Patient should go to rehabilitation with steroid and anticonvulsant medication ,And other anticoagulation as recommended by neurologist. And reevaluate patient for neurological recovery in next few weeks to decide the progress.   Patient's age history of atherosclerosis of carotid artery with 80% stenosis.  Patient's bipolar disease.  Dementia.  As well as history of multiple strokes needs to be considered.  If patient and family wants surgical neurosurgical opinion it would be recommended for patient to be transferred to [facility] or [facility].  Family is going to decide. Other option would be radiation therapy without making any diagnosis. Patient certainly needs to get stabilized neurologically for her to start radiation therapy I have discussed situation with radiation oncologist.   Also discussed all this recommendation with Dr. Max Sane who is  hospitalist taking care of the patient .      PLAN:  No problem-specific assessment & plan notes found for this encounter.   Patient expressed understanding and was in agreement with this plan. She also understands  that She can call clinic at any time with any questions, concerns, or complaints.    No matching staging information was found for the patient.  Forest Gleason, MD   11/10/2014 4:16 PM

## 2014-11-10 NOTE — Progress Notes (Signed)
[place] [facility] [place] NAME: Allison Gonzalez    MR#:  [id number]  DATE OF BIRTH:  [birth year]  SUBJECTIVE:  CHIEF COMPLAINT:   Chief Complaint  Patient presents with  . Recurrent UTI  awake and alert, pleasantly confused REVIEW OF SYSTEMS:  Review of Systems  Constitutional: Negative for fever, weight loss, malaise/fatigue and diaphoresis.  HENT: Negative for ear discharge, ear pain, hearing loss, nosebleeds, sore throat and tinnitus.   Eyes: Negative for blurred vision and pain.  Respiratory: Negative for cough, hemoptysis, shortness of breath and wheezing.   Cardiovascular: Negative for chest pain, palpitations, orthopnea and leg swelling.  Gastrointestinal: Negative for heartburn, nausea, vomiting, abdominal pain, diarrhea, constipation and blood in stool.  Genitourinary: Negative for dysuria, urgency and frequency.  Musculoskeletal: Negative for myalgias and back pain.  Skin: Negative for itching and rash.  Neurological: Positive for seizures and loss of consciousness. Negative for dizziness, tingling, tremors, focal weakness, weakness and headaches.  Psychiatric/Behavioral: Positive for depression. The patient is not nervous/anxious.    DRUG ALLERGIES:   Allergies  Allergen Reactions  . Penicillins Rash   VITALS:  Blood pressure 113/58, pulse 82, temperature 97.9 F (36.6 C), temperature source Oral, resp. rate 20, height 5\' 8"  (1.727 m), weight 100.4 kg (221 lb 5.5 oz), SpO2 100 %. PHYSICAL EXAMINATION:  Physical Exam  Constitutional: She is well-developed, well-nourished, and in no distress.  HENT:  Head: Normocephalic and atraumatic.  Eyes: Conjunctivae and EOM are normal. Pupils are equal, round, and reactive to light.  Neck: Normal range of motion. Neck supple. No tracheal deviation present. No thyromegaly present.  Cardiovascular: Normal rate, regular rhythm and normal heart sounds.   Pulmonary/Chest: Effort normal  and breath sounds normal. No respiratory distress. She has no wheezes. She exhibits no tenderness.  Abdominal: Soft. Bowel sounds are normal. She exhibits no distension. There is no tenderness.  Musculoskeletal: Normal range of motion.  Neurological: She is alert. No cranial nerve deficit.  Pleasantly confused  Skin: Skin is warm and dry. No rash noted.  Psychiatric: Mood and affect normal.   LABORATORY PANEL:   CBC  Recent Labs Lab 11/10/14 0522  WBC 7.2  HGB 13.0  HCT 39.7  PLT 212   ------------------------------------------------------------------------------------------------------------------ Chemistries   Recent Labs Lab 11/08/14 0854 11/10/14 0522  NA 139 137  K 4.6 3.7  CL 102 103  CO2 26 25  GLUCOSE 104* 109*  BUN 19 14  CREATININE 0.95 0.73  CALCIUM 10.0 9.3  AST 38  --   ALT 23  --   ALKPHOS 122  --   BILITOT 0.4  --    RADIOLOGY:  Ct Chest W Contrast  11/10/2014   CLINICAL DATA:  Altered mental status. Brain and breast masses. Bacteremia.  EXAM: CT CHEST WITH CONTRAST  TECHNIQUE: Multidetector CT imaging of the chest was performed during intravenous contrast administration.  CONTRAST:  4mL OMNIPAQUE IOHEXOL 300 MG/ML  SOLN  COMPARISON:  Chest radiograph of 11/08/2014.  No prior chest CT.  FINDINGS: Mediastinum/Nodes: Exclusion of the previously described right breast Mass. No axillary adenopathy. Tortuous thoracic aorta. Mild dilatation of the ascending segment at maximally 4.1 cm. Example coronal image 58. Moderate cardiomegaly with mild lipomatous hypertrophy of the interatrial septum. Multivessel coronary artery atherosclerosis. Pulmonary artery enlargement. No central pulmonary embolism, on this non-dedicated study. No mediastinal or hilar adenopathy. A small hiatal hernia. No internal mammary adenopathy.  Lungs/Pleura: Mild degradation secondary to motion and patient  arm position, not raised above the head. Mild bilateral pleural thickening, without  significant pleural fluid.  No dominant pulmonary mass or lobar consolidation.  Upper abdomen: Normal imaged portions of the liver, spleen, adrenal glands. Mild renal cortical thinning. Upper pole left renal low-density lesion at 1.8 cm. Twenty-five HU today versus 17 HU on 11/08/2014. Abdominal aortic and branch vessel atherosclerosis. Early contrast excretion into both renal collecting systems.  Musculoskeletal: Advanced bilateral glenohumeral joint osteoarthritis. Heterogeneous marrow density, primarily felt to be due to osteopenia. Mild T5 superior endplate irregularity and sclerosis.  IMPRESSION: 1. Position and motion degraded exam. 2. Given this factor, no evidence of primary neoplasm, metastatic disease, or infection within the chest. 3. Aortic tortuosity with mild dilatation of the ascending aorta. Recommend annual imaging followup by CTA or MRA. This recommendation follows 2010 ACCF/AHA/AATS/ACR/ASA/SCA/SCAI/SIR/STS/SVM Guidelines for the Diagnosis and Management of Patients with Thoracic Aortic Disease. Circulation. 2010; 121: N562-Z308 4. Pulmonary artery enlargement suggests pulmonary arterial hypertension. 5.  Atherosclerosis, including within the coronary arteries. 6. Upper pole left renal lesion which is technically indeterminate. Most likely a minimally complex cyst. Given the dominant brain mass, this is of questionable clinical significance. Ultrasound followup at 6 months could be performed to confirm size stability.   Electronically Signed   By: Abigail Miyamoto M.D.   On: 11/10/2014 12:33   Mr Jeri Cos Contrast  11/10/2014   CLINICAL DATA:  Breast cancer.  Seizures.  EXAM: MRI HEAD WITH CONTRAST  TECHNIQUE: Multiplanar, multiecho pulse sequences of the brain and surrounding structures were obtained with intravenous contrast.  COMPARISON:  None.  CONTRAST:  35mL MULTIHANCE GADOBENATE DIMEGLUMINE 529 MG/ML IV SOLN  FINDINGS: Post infusion, there is a solitary intra-axial mass lesion, RIGHT  posterior temporal lobe, measuring 34 x 50 x 40 mm cross-section (R-L x A-P x C-C). Marked surrounding vasogenic edema. The mass is sufficiently large as to be both RIGHT atrial periventricular as well as abutting the dura laterally and transverse sinus inferiorly. There are variable degrees of central necrosis but overall the mass shows avid enhancement. No other similar lesions are seen in the brain on this motion degraded scan. Despite moderate edema, no significant midline shift.  IMPRESSION: Solitary 34 x 50 x 40 mm intra-axial RIGHT posterior temporal mass lesion, avidly enhancing, with areas of central necrosis.  Solitary breast cancer metastasis is favored. Other intra-axial tumors, such as GBM or lymphoma are less likely. The imaging appearance is not strongly suggestive of brain abscess, but cannot completely be excluded given the heterogeneous diffusion restriction on prior non con MR, and the presence of Gram positive cocci bacteremia. Tissue sampling may be warranted.   Electronically Signed   By: Staci Righter M.D.   On: 11/10/2014 11:13   ASSESSMENT AND PLAN:   * Acute CVA: Discussed with oncology and neurology.  They both are in agreement that this could be solitary brain cancer metastasis and recommend continuing aspirin and statin - Continue Keppra and steroids - Decadron - Based on discussion with neurology and oncology.  Consider placement to rehabilitation for 2-3 weeks after which, based on her improvement or response.  Oncology will consider radiation if family is in agreement and her clinical picture  * Seizure: Likely from CVA or mass effect, appreciate neurology input.  Continue Keppra and Decadron  * Sepsis: present on admission due to Recurrent UTI: Continue vancomycin + ceftriaxone for now, her blood c/s growing coagulase negative staph which is likely contaminant.  If ID is okay - we  will stop antibiotics tomorrow  * Uncontrolled Hypertension: We will let blood pressure auto  regulate in acute CVA phase.  Systolic blood pressure goal 140-160  * Hyperlipidemia: Continue statin  * History of breast cancer with right breat mass ( 4.5 cm):  discussed with Dr. Oliva Bustard.  May consider radiation as an outpatient after 2-3 weeks of rehabilitation    Fall, aspiration and seizure precaution.   All the records are reviewed and case discussed with Care Management/Social Worker. Management plans discussed with the patient, family and they are in agreement.  CODE STATUS: Full code  TOTAL TIME TAKING CARE OF THIS PATIENT: 35 minutes.   More than 50% of the time was spent in counseling/coordination of care: YES   POSSIBLE D/C IN 1-2 DAYS, DEPENDING ON CLINICAL CONDITION.     [facility], Nevyn Bossman M.D on 11/10/2014 at 3:28 PM  Between 7am to 6pm - Pager - [phone]  After 6pm go to [email or web address] - password [email or web address] [facility]  [facility]  Office  [phone]  CC: Primary care physician; Adrian Prows, MD

## 2014-11-10 NOTE — Progress Notes (Signed)
[facility] PROGRESS NOTE Date of Admission:  11/08/2014     ID: Allison Gonzalez is a 81 y.o. female with AMS, fever, bacteremia, brain mass  Principal Problem:   Acute CVA (cerebrovascular accident) Active Problems:   UTI (lower urinary tract infection)   Subjective: No fevers, sitting up side bed with PT.  ROS  Eleven systems are reviewed and negative except per hpi  Medications:  Antibiotics Given (last 72 hours)    Date/Time Action Medication Dose Rate   11/09/14 1025 Given   vancomycin (VANCOCIN) 1,250 mg in sodium chloride 0.9 % 250 mL IVPB 1,250 mg 166.7 mL/hr   11/09/14 1227 Given   levofloxacin (LEVAQUIN) IVPB 750 mg 750 mg 100 mL/hr   11/09/14 1929 Given   vancomycin (VANCOCIN) 1,250 mg in sodium chloride 0.9 % 250 mL IVPB 1,250 mg 166.7 mL/hr   11/09/14 2313 Given   cefTRIAXone (ROCEPHIN) 1 g in dextrose 5 % 50 mL IVPB 1 g 100 mL/hr   11/10/14 1335 Given   vancomycin (VANCOCIN) 1,250 mg in sodium chloride 0.9 % 250 mL IVPB 1,250 mg 166.7 mL/hr     . allopurinol  300 mg Oral Daily  . antiseptic oral rinse  7 mL Mouth Rinse BID  . aspirin  300 mg Rectal Daily  . atorvastatin  10 mg Oral q1800  . cefTRIAXone (ROCEPHIN)  IV  1 g Intravenous Q24H  . cholecalciferol  2,000 Units Oral Daily  . dexamethasone  10 mg Intravenous Q12H  . FLUoxetine  40 mg Oral Daily  . heparin  5,000 Units Subcutaneous 3 times per day  . levETIRAcetam  750 mg Oral BID  . levothyroxine  200 mcg Oral QAC breakfast  . losartan  25 mg Oral Daily  . multivitamin with minerals  1 tablet Oral Daily  . pantoprazole  40 mg Oral QHS  . vancomycin  1,250 mg Intravenous Q18H    Objective: Vital signs in last 24 hours: Temp:  [96.5 F (35.8 C)-98.5 F (36.9 C)] 97.6 F (36.4 C) (08/16 0530) Pulse Rate:  [77-90] 86 (08/16 1354) Resp:  [18-20] 20 (08/16 1354) BP: (105-141)/(50-58) 113/50 mmHg (08/16 1354) SpO2:  [97 %-100 %] 100 % (08/16 1354) Constitutional: Obese,  lying in bed, confused. But awake HENT: [place]/AT, PERRLA, no scleral icterus Mouth/Throat: Oropharynx is clear and dry . No oropharyngeal exudate.  Cardiovascular: Normal rate, regular rhythm and normal heart sounds. 1/6 sm Pulmonary/Chest: Effort normal and breath sounds normal. No respiratory distress. has no wheezes.  Neck supple, no nuchal rigidity Abdominal: Soft. Bowel sounds are normal. exhibits no distension. There is no tenderness.  Lymphadenopathy: no cervical adenopathy. No axillary adenopathy Neurological: . Alert but somewhat confused, moves arms legs Skin: Skin is warm and dry. No rash noted. No erythema.  Psychiatric: flat affect   Lab Results  Recent Labs  11/08/14 0854 11/10/14 0522  WBC 15.5* 7.2  HGB 14.5 13.0  HCT 45.5 39.7  NA 139 137  K 4.6 3.7  CL 102 103  CO2 26 25  BUN 19 14  CREATININE 0.95 0.73    Microbiology: Results for orders placed or performed during the hospital encounter of 11/08/14  Blood Culture (routine x 2)     Status: None (Preliminary result)   Collection Time: 11/08/14  8:55 AM  Result Value Ref Range Status   Specimen Description BLOOD RIGHT ARM  Final   Special Requests   Final    BOTTLES DRAWN AEROBIC AND ANAEROBIC  AER 5CC ANA 2CC   Culture  Setup Time   Final    GRAM POSITIVE COCCI IN CLUSTERS IN BOTH AEROBIC AND ANAEROBIC BOTTLES CRITICAL RESULT CALLED TO, READ BACK BY AND VERIFIED WITH: ERIN PARDINI AT 0820 11/09/14 [facility] CRITICAL VALUE NOTED.  VALUE IS CONSISTENT WITH PREVIOUSLY REPORTED AND CALLED VALUE.    Culture   Final    COAGULASE NEGATIVE STAPHYLOCOCCUS IN BOTH AEROBIC AND ANAEROBIC BOTTLES IDENTIFICATION TO FOLLOW    Report Status PENDING  Incomplete  Urine culture     Status: None   Collection Time: 11/08/14  8:56 AM  Result Value Ref Range Status   Specimen Description URINE, RANDOM  Final   Special Requests Normal  Final   Culture MULTIPLE SPECIES PRESENT, SUGGEST RECOLLECTION  Final   Report Status  11/09/2014 FINAL  Final  Blood Culture (routine x 2)     Status: None (Preliminary result)   Collection Time: 11/08/14  9:00 AM  Result Value Ref Range Status   Specimen Description BLOOD LEFT FATTY CASTS  Final   Special Requests   Final    BOTTLES DRAWN AEROBIC AND ANAEROBIC  AER 4CC ANA 1CC   Culture  Setup Time   Final    GRAM POSITIVE COCCI IN CLUSTERS AEROBIC BOTTLE ONLY CRITICAL RESULT CALLED TO, READ BACK BY AND VERIFIED WITH: ERIN PARDINI AT 0820 11/09/14 CTJ    Culture   Final    COAGULASE NEGATIVE STAPHYLOCOCCUS AEROBIC BOTTLE ONLY IDENTIFICATION TO FOLLOW    Report Status PENDING  Incomplete  Culture, blood (routine x 2)     Status: None (Preliminary result)   Collection Time: 11/09/14 10:18 PM  Result Value Ref Range Status   Specimen Description BLOOD LEFT HAND  Final   Special Requests   Final    BOTTLES DRAWN AEROBIC AND ANAEROBIC 5MLANAEROBIC, 8MLAEROBIC   Culture NO GROWTH < 12 HOURS  Final   Report Status PENDING  Incomplete  Culture, blood (routine x 2)     Status: None (Preliminary result)   Collection Time: 11/09/14 10:25 PM  Result Value Ref Range Status   Specimen Description BLOOD RIGHT HAND  Final   Special Requests BOTTLES DRAWN AEROBIC AND ANAEROBIC 5ML  Final   Culture NO GROWTH < 12 HOURS  Final   Report Status PENDING  Incomplete    Studies/Results: Ct Chest W Contrast  11/10/2014   CLINICAL DATA:  Altered mental status. Brain and breast masses. Bacteremia.  EXAM: CT CHEST WITH CONTRAST  TECHNIQUE: Multidetector CT imaging of the chest was performed during intravenous contrast administration.  CONTRAST:  9mL OMNIPAQUE IOHEXOL 300 MG/ML  SOLN  COMPARISON:  Chest radiograph of 11/08/2014.  No prior chest CT.  FINDINGS: Mediastinum/Nodes: Exclusion of the previously described right breast Mass. No axillary adenopathy. Tortuous thoracic aorta. Mild dilatation of the ascending segment at maximally 4.1 cm. Example coronal image 58. Moderate cardiomegaly  with mild lipomatous hypertrophy of the interatrial septum. Multivessel coronary artery atherosclerosis. Pulmonary artery enlargement. No central pulmonary embolism, on this non-dedicated study. No mediastinal or hilar adenopathy. A small hiatal hernia. No internal mammary adenopathy.  Lungs/Pleura: Mild degradation secondary to motion and patient arm position, not raised above the head. Mild bilateral pleural thickening, without significant pleural fluid.  No dominant pulmonary mass or lobar consolidation.  Upper abdomen: Normal imaged portions of the liver, spleen, adrenal glands. Mild renal cortical thinning. Upper pole left renal low-density lesion at 1.8 cm. Twenty-five HU today versus 17 HU on 11/08/2014. Abdominal  aortic and branch vessel atherosclerosis. Early contrast excretion into both renal collecting systems.  Musculoskeletal: Advanced bilateral glenohumeral joint osteoarthritis. Heterogeneous marrow density, primarily felt to be due to osteopenia. Mild T5 superior endplate irregularity and sclerosis.  IMPRESSION: 1. Position and motion degraded exam. 2. Given this factor, no evidence of primary neoplasm, metastatic disease, or infection within the chest. 3. Aortic tortuosity with mild dilatation of the ascending aorta. Recommend annual imaging followup by CTA or MRA. This recommendation follows 2010 ACCF/AHA/AATS/ACR/ASA/SCA/SCAI/SIR/STS/SVM Guidelines for the Diagnosis and Management of Patients with Thoracic Aortic Disease. Circulation. 2010; 121: Q595-G387 4. Pulmonary artery enlargement suggests pulmonary arterial hypertension. 5.  Atherosclerosis, including within the coronary arteries. 6. Upper pole left renal lesion which is technically indeterminate. Most likely a minimally complex cyst. Given the dominant brain mass, this is of questionable clinical significance. Ultrasound followup at 6 months could be performed to confirm size stability.   Electronically Signed   By: Abigail Miyamoto M.D.   On:  11/10/2014 12:33   Mr Brain Wo Contrast  11/08/2014   CLINICAL DATA:  Found unresponsive 3 days ago. Concern for acute stroke. History of right breast cancer.  EXAM: MRI HEAD WITHOUT CONTRAST  MRA HEAD WITHOUT CONTRAST  TECHNIQUE: Multiplanar, multiecho pulse sequences of the brain and surrounding structures were obtained without intravenous contrast. Angiographic images of the head were obtained using MRA technique without contrast.  COMPARISON:  Head CT 11/08/2014  FINDINGS: MRI HEAD FINDINGS  There is cortically based, mildly restricted diffusion involving the posterior right insula and anterior right parietal lobe without definite corresponding T2 signal abnormality. There is a heterogeneously T2 hyperintense right posterior temporal intra-axial mass measuring 4.7 x 3.8 cm with heterogeneous areas of internal restricted diffusion. There is moderate surrounding vasogenic edema in the temporal, occipital, and parietal lobes with mass effect on the temporal and occipital horns of the right lateral ventricle. There is no midline shift.  There is mild-to-moderate cerebral atrophy, particularly in the left perisylvian region. No intracranial hemorrhage or extra-axial fluid collection is seen.  Prior bilateral cataract extraction is noted. Minimal posterior left ethmoid air cell mucosal thickening is present. Mastoid air cells are clear. Major intracranial vascular flow voids are preserved.  MRA HEAD FINDINGS  The study is mildly motion degraded.  Visualized distal vertebral arteries are patent with the left being slightly dominant. Evaluation of the proximal to mid V4 segments is partly limited due to technical factors with decreased signal in this region. There is suggestion of a moderate to severe proximal left V4 stenosis. PICA origins are not clearly identified. Right AICA and bilateral SCA origins are patent. Basilar artery is patent without stenosis. PCA origins are patent. There is no significant proximal  PCA stenosis, although there may be mild to moderate mid right P2 stenosis. The left P1 segment is patent without significant stenosis, however there is an apparent severe stenosis/ short segment occlusion of the proximal left P2 segment with reconstitution distally. Left PCA branch vessels distal the this are attenuated.  The intracranial right ICA is patent with mild supraclinoid stenosis. Intracranial left ICA appears mildly small in caliber diffusely compared to the right which may be developmental as the left A1 segment appears to be either absent or severely hypoplastic. There is diminished flow related enhancement in the distal left carotid siphon which progressively decreases with only a very small amount of signal at the carotid terminus and into the left MCA, suggestive of a severe supraclinoid ICA stenosis. The left M1 segment  is grossly patent although poorly evaluated given the decreased signal. There are multiple patent left M2 branches in the sylvian fissure. Right M1 and proximal M2 segments are patent without significant stenosis. There is mild distal right MCA branch vessel irregularity. Right A1 segment is patent without stenosis. Anterior communicating artery is patent. Right ACA supplies the left A2.  IMPRESSION: 1. 4.7 cm posterior right temporal lobe mass with moderate surrounding edema. Considerations include metastasis from patient's known breast cancer or primary CNS neoplasm. Further evaluation with post-contrast brain MRI is recommended. 2. Mildly restricted diffusion involving posterior right insula and anterior right parietal cortex, which could reflect a small acute infarct or sequelae of recent seizure activity. 3. Mildly degraded head MRA. Decreased flow related enhancement in the distal left ICA and proximal left MCA concerning for high-grade, flow reducing proximal left supraclinoid ICA stenosis. 4. Suspected moderate to severe proximal left V4 vertebral artery stenosis. 5. Severe  stenosis or short segment occlusion of the proximal left P2 segment.   Electronically Signed   By: Logan Bores   On: 11/08/2014 17:35   Mr Brain W Contrast  11/10/2014   CLINICAL DATA:  Breast cancer.  Seizures.  EXAM: MRI HEAD WITH CONTRAST  TECHNIQUE: Multiplanar, multiecho pulse sequences of the brain and surrounding structures were obtained with intravenous contrast.  COMPARISON:  None.  CONTRAST:  39mL MULTIHANCE GADOBENATE DIMEGLUMINE 529 MG/ML IV SOLN  FINDINGS: Post infusion, there is a solitary intra-axial mass lesion, RIGHT posterior temporal lobe, measuring 34 x 50 x 40 mm cross-section (R-L x A-P x C-C). Marked surrounding vasogenic edema. The mass is sufficiently large as to be both RIGHT atrial periventricular as well as abutting the dura laterally and transverse sinus inferiorly. There are variable degrees of central necrosis but overall the mass shows avid enhancement. No other similar lesions are seen in the brain on this motion degraded scan. Despite moderate edema, no significant midline shift.  IMPRESSION: Solitary 34 x 50 x 40 mm intra-axial RIGHT posterior temporal mass lesion, avidly enhancing, with areas of central necrosis.  Solitary breast cancer metastasis is favored. Other intra-axial tumors, such as GBM or lymphoma are less likely. The imaging appearance is not strongly suggestive of brain abscess, but cannot completely be excluded given the heterogeneous diffusion restriction on prior non con MR, and the presence of Gram positive cocci bacteremia. Tissue sampling may be warranted.   Electronically Signed   By: Staci Righter M.D.   On: 11/10/2014 11:13   US Carotid Bilateral  11/08/2014   CLINICAL DATA:  Unresponsive, right temporal mass  EXAM: BILATERAL CAROTID DUPLEX ULTRASOUND  TECHNIQUE: Pearline Cables scale imaging, color Doppler and duplex ultrasound were performed of bilateral carotid and vertebral arteries in the neck. The examination is limited is a patient did not complete the  left half of the study.  COMPARISON:  None.  FINDINGS: Criteria: Quantification of carotid stenosis is based on velocity parameters that correlate the residual internal carotid diameter with NASCET-based stenosis levels, using the diameter of the distal internal carotid lumen as the denominator for stenosis measurement.  The following velocity measurements were obtained:  RIGHT  ICA:  176/44 cm/sec  CCA:  95/62 cm/sec  SYSTOLIC ICA/CCA RATIO:  3.2  DIASTOLIC ICA/CCA RATIO:  2.9  ECA:  248 cm/sec  LEFT  Not completed at the patient's wishes  RIGHT CAROTID ARTERY: Diffuse atherosclerotic plaque is noted in the region of the carotid bulb and proximal internal carotid artery. The waveforms, velocities and flow velocity  ratios suggest a stenosis in the 50-69% range.  RIGHT VERTEBRAL ARTERY:  Antegrade in nature.  IMPRESSION: 50-69% stenosis in the right proximal internal carotid artery.  Patient refused further imaging and the left carotid artery was not interrogated.   Electronically Signed   By: Inez Catalina M.D.   On: 11/08/2014 18:41   Mr Jodene Nam Head/brain Wo Cm  11/08/2014   CLINICAL DATA:  Found unresponsive 3 days ago. Concern for acute stroke. History of right breast cancer.  EXAM: MRI HEAD WITHOUT CONTRAST  MRA HEAD WITHOUT CONTRAST  TECHNIQUE: Multiplanar, multiecho pulse sequences of the brain and surrounding structures were obtained without intravenous contrast. Angiographic images of the head were obtained using MRA technique without contrast.  COMPARISON:  Head CT 11/08/2014  FINDINGS: MRI HEAD FINDINGS  There is cortically based, mildly restricted diffusion involving the posterior right insula and anterior right parietal lobe without definite corresponding T2 signal abnormality. There is a heterogeneously T2 hyperintense right posterior temporal intra-axial mass measuring 4.7 x 3.8 cm with heterogeneous areas of internal restricted diffusion. There is moderate surrounding vasogenic edema in the temporal,  occipital, and parietal lobes with mass effect on the temporal and occipital horns of the right lateral ventricle. There is no midline shift.  There is mild-to-moderate cerebral atrophy, particularly in the left perisylvian region. No intracranial hemorrhage or extra-axial fluid collection is seen.  Prior bilateral cataract extraction is noted. Minimal posterior left ethmoid air cell mucosal thickening is present. Mastoid air cells are clear. Major intracranial vascular flow voids are preserved.  MRA HEAD FINDINGS  The study is mildly motion degraded.  Visualized distal vertebral arteries are patent with the left being slightly dominant. Evaluation of the proximal to mid V4 segments is partly limited due to technical factors with decreased signal in this region. There is suggestion of a moderate to severe proximal left V4 stenosis. PICA origins are not clearly identified. Right AICA and bilateral SCA origins are patent. Basilar artery is patent without stenosis. PCA origins are patent. There is no significant proximal PCA stenosis, although there may be mild to moderate mid right P2 stenosis. The left P1 segment is patent without significant stenosis, however there is an apparent severe stenosis/ short segment occlusion of the proximal left P2 segment with reconstitution distally. Left PCA branch vessels distal the this are attenuated.  The intracranial right ICA is patent with mild supraclinoid stenosis. Intracranial left ICA appears mildly small in caliber diffusely compared to the right which may be developmental as the left A1 segment appears to be either absent or severely hypoplastic. There is diminished flow related enhancement in the distal left carotid siphon which progressively decreases with only a very small amount of signal at the carotid terminus and into the left MCA, suggestive of a severe supraclinoid ICA stenosis. The left M1 segment is grossly patent although poorly evaluated given the decreased  signal. There are multiple patent left M2 branches in the sylvian fissure. Right M1 and proximal M2 segments are patent without significant stenosis. There is mild distal right MCA branch vessel irregularity. Right A1 segment is patent without stenosis. Anterior communicating artery is patent. Right ACA supplies the left A2.  IMPRESSION: 1. 4.7 cm posterior right temporal lobe mass with moderate surrounding edema. Considerations include metastasis from patient's known breast cancer or primary CNS neoplasm. Further evaluation with post-contrast brain MRI is recommended. 2. Mildly restricted diffusion involving posterior right insula and anterior right parietal cortex, which could reflect a small acute infarct or  sequelae of recent seizure activity. 3. Mildly degraded head MRA. Decreased flow related enhancement in the distal left ICA and proximal left MCA concerning for high-grade, flow reducing proximal left supraclinoid ICA stenosis. 4. Suspected moderate to severe proximal left V4 vertebral artery stenosis. 5. Severe stenosis or short segment occlusion of the proximal left P2 segment.   Electronically Signed   By: Logan Bores   On: 11/08/2014 17:35    Assessment/Plan: Allison Gonzalez is a 81 y.o. female admitted with AMS and found to have recurrent UTI (Proteus and E coli), Coag neg staph bacteremia, brain mass and breast mass.  CT scan did show bladder wall inflammation and a bladder diverticulum.  UA on admission had impressive pyuria and ucx is mixed organisms.  FU bcx are negative.  Wbc down 15-7.  No fevers I suspect the CNS bacteremia is contaminant Recommendations Continue vanco and  ceftiaxone for now pending cultures Echo (TTE) Neg  Further recs based on culture results. Thank you very much for the consult. Will follow with you.  [place], [place]   11/10/2014, 2:03 PM

## 2014-11-10 NOTE — Plan of Care (Signed)
Problem: Discharge Progression Outcomes Goal: Other Discharge Outcomes/Goals Outcome: Progressing Plan of care progress to goal for: 1. Pain-no c/o pain this shift 2. Hemodynamically-             -VSS, pt remains afebrile this shift             -IV fluids continue per orders             -IV ABT continue per orders 3. Complications-no evidence of this shift 4. Diet-pt had good po intake this shift 5. Activity-pt worked with pt today, was able to sit on the side of the bed with their assistance

## 2014-11-11 LAB — CULTURE, BLOOD (ROUTINE X 2)

## 2014-11-11 LAB — VANCOMYCIN, TROUGH: Vancomycin Tr: 17 ug/mL (ref 10–20)

## 2014-11-11 MED ORDER — VANCOMYCIN HCL 10 G IV SOLR
1500.0000 mg | INTRAVENOUS | Status: DC
  Filled 2014-11-11: qty 1500

## 2014-11-11 MED ORDER — LEVETIRACETAM 750 MG PO TABS: 750.0000 mg | ORAL_TABLET | Freq: Two times a day (BID) | ORAL | Status: AC

## 2014-11-11 MED ORDER — DEXAMETHASONE 4 MG PO TABS: 4.0000 mg | ORAL_TABLET | Freq: Three times a day (TID) | ORAL | Status: AC

## 2014-11-11 MED ORDER — ATORVASTATIN CALCIUM 40 MG PO TABS: 40.0000 mg | ORAL_TABLET | Freq: Every day | ORAL | Status: AC

## 2014-11-11 NOTE — Progress Notes (Signed)
   11/11/14 1000  Clinical Encounter Type  Visited With Patient and family together  Visit Type Spiritual support  Referral From Nurse  Consult/Referral To Chaplain  Spiritual Encounters  Spiritual Needs Prayer  Stress Factors  Patient Stress Factors Health changes  Family Stress Factors Health changes   Status: Acute CVA (cerebrovascular accident)/89yrs female Faith: Catholic/former Oceanographer Family: 2 sons, daughter n Sports coach, daughter bedside Visit Assessment: The patient's oldest Allison Gonzalez and younger son Allison Gonzalez and daughter and daughter n law as well as the patient requested a priest. The chaplain gave encouraging words as they explained that they lost their father/husband 5 mos ago and that they received hard news today after 7 days of unfavorable health conditions concerning the patient. She oldest shared that his mother was diag with cancer and had a miracle down in the ED this past week and that she had Coded lastnight. The chaplain did speak with their former [employer] and requested that the priest visit the patient when possible. Allison Gonzalez is making the arrangements;   Pastoral Care can be reached via pager [phone] or by online request

## 2014-11-11 NOTE — Progress Notes (Addendum)
Pt d/c to SNF. Report given to [facility] at [facility]. IV removed, pt left via EMS

## 2014-11-11 NOTE — Progress Notes (Signed)
Speech Language Pathology Treatment: Dysphagia  Patient Details Name: Allison Gonzalez MRN: [id number] DOB: Aug 31, 1929 Today's Date: 11/11/2014 Time: 0730-0800 SLP Time Calculation (min) (ACUTE ONLY): 30 min  Assessment / Plan / Recommendation Clinical Impression  Pt appeared to adequately and safely tolerate trials of Nectar liquids and purees fed to her following strict aspiration precautions; no overt s/s of aspiration noted. Pt presented w/ min. increased oral phase time w/ trials, min. disorganization and attention to task. Given time b/t trials, she cleared orally. Pt required moderate verbal/tactile cues to remain attentive to task. Her Cognitive status remains declined; communication and Cognitive status/abilities suspect impacted by her Dementia(per MD note) as well. Pt is at increased risk for aspiration at this time sec. to her declined Neurological status. MD indicated Neurosurgical involvement or SNF w/ steroid tx for the newly dx'd R temporal lobe mass. Rec. Continue w/ current diet at this time w/ aspiration precautions and monitoring at meals; meds in puree. Feeding assistance at all meals. NSG updated.    HPI Other Pertinent Information: Pt is an 81 y/o female recently d/c'd from this facility w/ UTI tx. Pt has a h/o breat CA w/ radiation tx, HTN, DM, UTI. Upon admission, she was found to have a 4.7 cm posterior right temporal lobe mass with moderate surrounding edema worrisome for metastasis from patient's known breast cancer or primary CNS neoplasm. Possible small acute infarct or sequelae of recent seizure activity. Decreased flow related enhancement in the distal left ICA and proximal left MCA. Concern for mass; Oncologist following. Pt has been tolerating her current Dys. I w/ Nectar liquids diet. Pt is less verbal this morning but looked to SLP when addressed w/ conversation and po trials.    Pertinent Vitals Pain Assessment:  (no indication made)  SLP Plan  Continue with current  plan of care    Recommendations Diet recommendations: Dysphagia 1 (puree);Nectar-thick liquid (w/ feeding assistance and monitoring) Liquids provided via: Teaspoon;Cup Medication Administration: Crushed with puree Supervision: Trained caregiver to feed patient;Full supervision/cueing for compensatory strategies Compensations: Slow rate;Small sips/bites;Minimize environmental distractions;Check for pocketing;Follow solids with liquid Postural Changes and/or Swallow Maneuvers: Seated upright 90 degrees              General recommendations:  (Dietician consult) Oral Care Recommendations: Oral care BID;Staff/trained caregiver to provide oral care Follow up Recommendations: Skilled Nursing facility Plan: Continue with current plan of care    [place], [place], CCC-SLP  Allison Gonzalez 11/11/2014, 10:47 AM

## 2014-11-11 NOTE — Plan of Care (Signed)
Problem: Discharge Progression Outcomes Goal: Discharge plan in place and appropriate Outcome: Progressing Individualization of Care Pt prefers to be called Allison Gonzalez Hx of breast CA, HTN, depression, anxiety, sleep apnea, hypotension, gout, hypercholesterolemia, fungal rash on buttocks   1. Discharge Plan: Neuro checks performed every 4 hours. 2. Pain: Patient on numeric scale this shift. Patient is comfortable and without pain - rating pain 0/10. 3. Hemodynamically stable: VSS this shift. BP 144/70 mmHg  Pulse 74  Temp(Src) 97.5 F (36.4 C) (Oral)  Resp 18  Ht 5\' 8"  (1.727 m)  Wt 221 lb 5.5 oz (100.4 kg)  BMI 33.66 kg/m2  SpO2 98% 4. Complications: Patient complications improving gradually. Patient speech continuing to improve.  She is speaking in more complete sentences and answering questions. 5. Diet: Patient on Dys1 and thick liquid diet. Pills given in pureed food this evening with success. Patient did eat dinner this evening with 1:1 feed. Tolerated well. 5. Activity: Patient is on bedrest and continues with significant left side weakness.

## 2014-11-11 NOTE — Discharge Instructions (Signed)
Metastatic Brain Tumor °A metastatic brain tumor is a growth in your brain. It is made of cancer cells from another part of your body that traveled to your brain through your bloodstream, along the nerves of your brain (cranial nerves), or through the opening at the base of your skull.  °CAUSES  °The most common cause of a metastatic brain tumor in men is lung cancer. In women, it is breast cancer. Other common causes include: °· Unknown types of cancers. °· Skin cancer (melanoma). °· Colon cancer. °· Kidney cancer. °SIGNS AND SYMPTOMS  °Most signs and symptoms of metastatic brain tumors are caused by increased pressure inside your brain. A headache is often the first symptom. Eventually, almost everyone with a metastatic brain tumor has a headache. Other signs and symptoms include: °· Vomiting. °· Weakness or fatigue. °· Seizures. °· Sensory problems, like tingling or numbness. °· Problems walking. °· Clumsiness. °· Emotional changes. °· Personality changes. °· Changes in speech or vision. °DIAGNOSIS  °Your health care provider can diagnose a metastatic brain tumor from your medical history and physical exam. You may also have some additional tests, including: °· A nervous system function test (neurologic exam). °· Imaging studies of your brain, such as an MRI and CT scan. °· Having a small piece of tissue removed (biopsy) from your brain to be checked under a microscope. °TREATMENT  °Treatment depends on the type of cancer you have and your overall health. It also depends on the size of your tumor and the number of brain tumors you have. The most common treatments include: °· Medicines to control pain, nausea, and seizures. °· Cancer-killing drugs (chemotherapy). °· An X-ray treatment called radiation therapy to kill brain tumor cells. °· A type of radiation therapy that is targeted to exact locations in the brain (stereotactic radiotherapy). °· Surgery to remove brain tumors. °HOME CARE INSTRUCTIONS °· Only take  medicines as directed by your health care provider. °· Do not drive if: °¨ You are taking strong pain medicines. °¨ Your vision or thinking is not clear. °· Take two 10-minute walks every day if you are able. Exercising is a good way to relieve stress. It can also help you sleep. °· Be sure to get enough calories and protein in your diet. °¨ If you have a poor appetite or feel nauseous, eat small meals often. °¨ Supplement your diet with protein shakes or milk shakes. °· It is common to have anxiety and depression when you are coping with cancer. °¨ Talk to friends and loved ones about your feelings. °¨ Have a good support system at home. °SEEK MEDICAL CARE IF: °· You have chills or fever. °· Your medicine is not controlling your symptoms. °· Your symptoms get worse or you develop new symptoms. °· You are having trouble caring for yourself or being cared for at home. °· You are struggling with anxiety or depression. °SEEK IMMEDIATE MEDICAL CARE IF: °· You cannot stop vomiting. °· You cannot keep down any foods or fluids. °· You have sudden changes in speech or vision. °· You have a seizure. °· You can no longer take care of yourself at home. °Document Released: 12/08/2003 Document Revised: 07/28/2013 Document Reviewed: 03/10/2008 °ExitCare® Patient Information ©2015 [employer]. This information is not intended to replace advice given to you by your health care provider. Make sure you discuss any questions you have with your health care provider. ° °

## 2014-11-11 NOTE — Discharge Summary (Signed)
[facility] [place] NAME: Allison Gonzalez    MR#:  [id number]  DATE OF BIRTH:  07-Sep-1929  DATE OF ADMISSION:  11/08/2014 ADMITTING PHYSICIAN: Demetrios Loll, MD  DATE OF DISCHARGE: 11/11/2014  PRIMARY CARE PHYSICIAN: Adrian Prows, MD    ADMISSION DIAGNOSIS:  Complicated UTI (urinary tract infection) [N39.0] Unresponsive state [R40.4] Acute CVA (cerebrovascular accident) [I63.9] Sepsis, due to unspecified organism [A41.9] - Ruled out  DISCHARGE DIAGNOSIS:  Principal Problem:   Acute CVA (cerebrovascular accident) Active Problems:   UTI (lower urinary tract infection) Her UTI has been treated. SECONDARY DIAGNOSIS:   Past Medical History  Diagnosis Date  . Breast cancer 2015    positive w/ radiation  . Hypertension   . Depression   . Anxiety   . Sleep apnea   . Hypothyroidism   . Gout of right foot 10/21/14  . Gout of right foot   . Hypercholesterolemia 10/21/14  . Diabetes mellitus without complication    HOSPITAL COURSE:  81 y.o. female with a known history of recurrent UTI, hypertension, hyperlipidemia and diabetes who was admitted for acute metabolic encephalopathy. Please see Dr. Lianne Moris dictated history and physical for further details.  Her signs and symptoms were concerning for acute stroke.  There was also concern for possible sepsis due to UTI.  Sepsis was subsequently ruled out and she was treated for urinary tract infection appropriately.  Patient underwent complete neurological workup.  While she was getting an echocardiogram.  She had a seizure.  In neurology consultation was obtained with Dr. Irish Elders.  She underwent MRI of the brain which showed right temporal mass (4.7 cm) with peripheral edema suggestive of either metastasis or primary brain tumor.  Knowing her history of stage I breast cancer, oncology consultation was obtained with Dr. Oliva Bustard who recommended starting Decadron and Keppra, neurology was in  agreement with the same as considering her advanced age and multiple: Comorbidities, she would not be an ideal candidate for tissue biopsy for this large 4.5 cm mass in the brain.  Infectious disease consultation was obtained with Dr. Adrian Prows, who agreed with above management and treatment of UTI.  Her blood cultures were false positive, growing coagulase negative staph, likely skin contaminant.  She was also noted to have significant stenosis in the right proximal internal carotid artery for which vascular surgery consultation was obtained with Dr. dew who recommended conservative management as she would not be an operative candidate.  After long discussion with patient and her family, decision was made to discharge her to rehabilitation based on physical therapy assessment for at least 2-3 weeks after which she will have reassessment at oncology Center here at [facility] to decide need for radiation therapy.  DISCHARGE CONDITIONS:  Stable CONSULTS OBTAINED:  Treatment Team:  Forest Gleason, MD Algernon Huxley, MD Leotis Pain, MD Adrian Prows, MD DRUG ALLERGIES:   Allergies  Allergen Reactions  . Penicillins Rash   DISCHARGE MEDICATIONS:   Current Discharge Medication List    START taking these medications   Details  dexamethasone (DECADRON) 4 MG tablet Take 1 tablet (4 mg total) by mouth 3 (three) times daily. Start 4 mg every 8 hours for 3 days followed by 40 mg twice a day for 2 days and then stop. Qty: 13 tablet, Refills: 0    levETIRAcetam (KEPPRA) 750 MG tablet Take 1 tablet (750 mg total) by mouth 2 (two) times daily. Qty: 60 tablet, Refills: 0   Associated Diagnoses:  Acute CVA (cerebrovascular accident); Stroke      CONTINUE these medications which have CHANGED   Details  atorvastatin (LIPITOR) 40 MG tablet Take 1 tablet (40 mg total) by mouth daily. Qty: 30 tablet, Refills: 0      CONTINUE these medications which have NOT CHANGED   Details  allopurinol  (ZYLOPRIM) 300 MG tablet Take 300 mg by mouth daily.    aspirin EC 81 MG tablet Take 81 mg by mouth daily.    buPROPion (WELLBUTRIN SR) 150 MG 12 hr tablet Take 150 mg by mouth daily.    Cholecalciferol (VITAMIN D-3) 1000 UNITS CAPS Take 2,000 Units by mouth daily.    docusate sodium (COLACE) 100 MG capsule Take 100 mg by mouth 2 (two) times daily as needed for mild constipation.     esomeprazole (NEXIUM) 20 MG capsule Take 40 mg by mouth at bedtime.    FLUoxetine (PROZAC) 40 MG capsule Take 40 mg by mouth daily.    letrozole (FEMARA) 2.5 MG tablet Take 2.5 mg by mouth daily.    levothyroxine (SYNTHROID, LEVOTHROID) 200 MCG tablet Take 200 mcg by mouth daily before breakfast.    LORazepam (ATIVAN) 0.5 MG tablet Take 0.5 mg by mouth every 8 (eight) hours as needed for anxiety.     losartan (COZAAR) 25 MG tablet Take 25 mg by mouth daily.    Multiple Vitamins-Minerals ([employer] SPECTRAVITE ADULT 50+) TABS Take 1 tablet by mouth daily.    nystatin cream (MYCOSTATIN) Apply 1 application topically 2 (two) times daily.    potassium chloride SA (K-DUR,KLOR-CON) 20 MEQ tablet Take 20 mEq by mouth 2 (two) times daily.      STOP taking these medications     ciprofloxacin (CIPRO) 500 MG tablet        DISCHARGE INSTRUCTIONS:   DIET:  Cardiac diet  DISCHARGE CONDITION:  Fair  ACTIVITY:  Activity as tolerated  OXYGEN:  Home Oxygen: No.   Oxygen Delivery: room air  DISCHARGE LOCATION:  nursing home   If you experience worsening of your admission symptoms, develop shortness of breath, life threatening emergency, suicidal or homicidal thoughts you must seek medical attention immediately by calling 911 or calling your MD immediately  if symptoms less severe.  You Must read complete instructions/literature along with all the possible adverse reactions/side effects for all the Medicines you take and that have been prescribed to you. Take any new Medicines after you have completely  understood and accpet all the possible adverse reactions/side effects.   Please note  You were cared for by a hospitalist during your hospital stay. If you have any questions about your discharge medications or the care you received while you were in the hospital after you are discharged, you can call the unit and asked to speak with the hospitalist on call if the hospitalist that took care of you is not available. Once you are discharged, your primary care physician will handle any further medical issues. Please note that NO REFILLS for any discharge medications will be authorized once you are discharged, as it is imperative that you return to your primary care physician (or establish a relationship with a primary care physician if you do not have one) for your aftercare needs so that they can reassess your need for medications and monitor your lab values.    On the day of Discharge:   VITAL SIGNS:  Blood pressure 137/64, pulse 69, temperature 97.6 F (36.4 C), temperature source Oral, resp. rate 20, height 5'  8" (1.727 m), weight 100.4 kg (221 lb 5.5 oz), SpO2 99 %.  I/O:   Intake/Output Summary (Last 24 hours) at 11/11/14 1228 Last data filed at 11/11/14 0900  Gross per 24 hour  Intake    610 ml  Output      0 ml  Net    610 ml    PHYSICAL EXAMINATION:  GENERAL:  81 y.o.-year-old patient lying in the bed with no acute distress.  EYES: Pupils equal, round, reactive to light and accommodation. No scleral icterus. Extraocular muscles intact.  HEENT: Head atraumatic, normocephalic. Oropharynx and nasopharynx clear.  NECK:  Supple, no jugular venous distention. No thyroid enlargement, no tenderness.  LUNGS: Normal breath sounds bilaterally, no wheezing, rales,rhonchi or crepitation. No use of accessory muscles of respiration.  CARDIOVASCULAR: S1, S2 normal. No murmurs, rubs, or gallops.  ABDOMEN: Soft, non-tender, non-distended. Bowel sounds present. No organomegaly or mass.   EXTREMITIES: No pedal edema, cyanosis, or clubbing.  NEUROLOGIC: EOM intact, Able to tell me name but not date or time or reason she is in hospital. Motor generalized weakness without any sensory deficits, Coordination intact. Gait not examined.  PSYCHIATRIC: The patient is alert and oriented x 3.  SKIN: No obvious rash, lesion, or ulcer.  DATA REVIEW:   CBC  Recent Labs Lab 11/10/14 0522  WBC 7.2  HGB 13.0  HCT 39.7  PLT 212    Chemistries   Recent Labs Lab 11/08/14 0854 11/10/14 0522  NA 139 137  K 4.6 3.7  CL 102 103  CO2 26 25  GLUCOSE 104* 109*  BUN 19 14  CREATININE 0.95 0.73  CALCIUM 10.0 9.3  AST 38  --   ALT 23  --   ALKPHOS 122  --   BILITOT 0.4  --     Cardiac Enzymes  Recent Labs Lab 11/08/14 0854  TROPONINI <0.03    Microbiology Results  Results for orders placed or performed during the hospital encounter of 11/08/14  Blood Culture (routine x 2)     Status: None   Collection Time: 11/08/14  8:55 AM  Result Value Ref Range Status   Specimen Description BLOOD RIGHT ARM  Final   Special Requests   Final    BOTTLES DRAWN AEROBIC AND ANAEROBIC  AER 5CC ANA 2CC   Culture  Setup Time   Final    GRAM POSITIVE COCCI IN CLUSTERS IN BOTH AEROBIC AND ANAEROBIC BOTTLES CRITICAL RESULT CALLED TO, READ BACK BY AND VERIFIED WITH: ERIN PARDINI AT 9937 11/09/14 [facility] CRITICAL VALUE NOTED.  VALUE IS CONSISTENT WITH PREVIOUSLY REPORTED AND CALLED VALUE.    Culture   Final    STAPHYLOCOCCUS EPIDERMIDIS IN BOTH AEROBIC AND ANAEROBIC BOTTLES    Report Status 11/11/2014 FINAL  Final   Organism ID, Bacteria STAPHYLOCOCCUS EPIDERMIDIS  Final      Susceptibility   Staphylococcus epidermidis - MIC*    CIPROFLOXACIN >=8 RESISTANT Resistant     ERYTHROMYCIN >=8 RESISTANT Resistant     GENTAMICIN <=0.5 SENSITIVE Sensitive     OXACILLIN Value in next row Resistant      >=4 RESISTANTWARNING: For oxacillin-resistant S.aureus and coagulase-negative staphylococci  (MRS), other beta-lactam agents, ie, penicillins, beta-lactam/beta-lactamase inhibitor combinations, cephems (with the exception of the cephalosporins with anti-MRSA activity), and carbapenems, may appear active in vitro, but are not effective clinically.  --CLSI, Vol.32 No.3, January 2012, pg 70.    TETRACYCLINE Value in next row Sensitive      >=4 RESISTANTWARNING: For oxacillin-resistant S.aureus  and coagulase-negative staphylococci (MRS), other beta-lactam agents, ie, penicillins, beta-lactam/beta-lactamase inhibitor combinations, cephems (with the exception of the cephalosporins with anti-MRSA activity), and carbapenems, may appear active in vitro, but are not effective clinically.  --CLSI, Vol.32 No.3, January 2012, pg 70.    VANCOMYCIN Value in next row Sensitive      >=4 RESISTANTWARNING: For oxacillin-resistant S.aureus and coagulase-negative staphylococci (MRS), other beta-lactam agents, ie, penicillins, beta-lactam/beta-lactamase inhibitor combinations, cephems (with the exception of the cephalosporins with anti-MRSA activity), and carbapenems, may appear active in vitro, but are not effective clinically.  --CLSI, Vol.32 No.3, January 2012, pg 70.    CLINDAMYCIN Value in next row Resistant      >=4 RESISTANTWARNING: For oxacillin-resistant S.aureus and coagulase-negative staphylococci (MRS), other beta-lactam agents, ie, penicillins, beta-lactam/beta-lactamase inhibitor combinations, cephems (with the exception of the cephalosporins with anti-MRSA activity), and carbapenems, may appear active in vitro, but are not effective clinically.  --CLSI, Vol.32 No.3, January 2012, pg 70.    * STAPHYLOCOCCUS EPIDERMIDIS  Urine culture     Status: None   Collection Time: 11/08/14  8:56 AM  Result Value Ref Range Status   Specimen Description URINE, RANDOM  Final   Special Requests Normal  Final   Culture MULTIPLE SPECIES PRESENT, SUGGEST RECOLLECTION  Final   Report Status 11/09/2014 FINAL  Final   Blood Culture (routine x 2)     Status: None (Preliminary result)   Collection Time: 11/08/14  9:00 AM  Result Value Ref Range Status   Specimen Description BLOOD LEFT FATTY CASTS  Final   Special Requests   Final    BOTTLES DRAWN AEROBIC AND ANAEROBIC  AER 4CC ANA 1CC   Culture  Setup Time   Final    GRAM POSITIVE COCCI IN CLUSTERS AEROBIC BOTTLE ONLY CRITICAL RESULT CALLED TO, READ BACK BY AND VERIFIED WITH: ERIN PARDINI AT 0820 11/09/14 [facility]    Culture   Final    COAGULASE NEGATIVE STAPHYLOCOCCUS AEROBIC BOTTLE ONLY Results consistent with contamination. NOT THE SAME COAGULASE NEGATIVE STAPHYLOCOCCUS AS THE OTHER SET    Report Status PENDING  Incomplete  Culture, blood (routine x 2)     Status: None (Preliminary result)   Collection Time: 11/09/14 10:18 PM  Result Value Ref Range Status   Specimen Description BLOOD LEFT HAND  Final   Special Requests   Final    BOTTLES DRAWN AEROBIC AND ANAEROBIC 5MLANAEROBIC, 8MLAEROBIC   Culture NO GROWTH 2 DAYS  Final   Report Status PENDING  Incomplete  Culture, blood (routine x 2)     Status: None (Preliminary result)   Collection Time: 11/09/14 10:25 PM  Result Value Ref Range Status   Specimen Description BLOOD RIGHT HAND  Final   Special Requests BOTTLES DRAWN AEROBIC AND ANAEROBIC 5ML  Final   Culture NO GROWTH 2 DAYS  Final   Report Status PENDING  Incomplete    RADIOLOGY:  Ct Chest W Contrast  11/10/2014   CLINICAL DATA:  Altered mental status. Brain and breast masses. Bacteremia.  EXAM: CT CHEST WITH CONTRAST  TECHNIQUE: Multidetector CT imaging of the chest was performed during intravenous contrast administration.  CONTRAST:  51mL OMNIPAQUE IOHEXOL 300 MG/ML  SOLN  COMPARISON:  Chest radiograph of 11/08/2014.  No prior chest CT.  FINDINGS: Mediastinum/Nodes: Exclusion of the previously described right breast Mass. No axillary adenopathy. Tortuous thoracic aorta. Mild dilatation of the ascending segment at maximally 4.1 cm. Example  coronal image 58. Moderate cardiomegaly with mild lipomatous hypertrophy of the interatrial septum.  Multivessel coronary artery atherosclerosis. Pulmonary artery enlargement. No central pulmonary embolism, on this non-dedicated study. No mediastinal or hilar adenopathy. A small hiatal hernia. No internal mammary adenopathy.  Lungs/Pleura: Mild degradation secondary to motion and patient arm position, not raised above the head. Mild bilateral pleural thickening, without significant pleural fluid.  No dominant pulmonary mass or lobar consolidation.  Upper abdomen: Normal imaged portions of the liver, spleen, adrenal glands. Mild renal cortical thinning. Upper pole left renal low-density lesion at 1.8 cm. Twenty-five HU today versus 17 HU on 11/08/2014. Abdominal aortic and branch vessel atherosclerosis. Early contrast excretion into both renal collecting systems.  Musculoskeletal: Advanced bilateral glenohumeral joint osteoarthritis. Heterogeneous marrow density, primarily felt to be due to osteopenia. Mild T5 superior endplate irregularity and sclerosis.  IMPRESSION: 1. Position and motion degraded exam. 2. Given this factor, no evidence of primary neoplasm, metastatic disease, or infection within the chest. 3. Aortic tortuosity with mild dilatation of the ascending aorta. Recommend annual imaging followup by CTA or MRA. This recommendation follows 2010 ACCF/AHA/AATS/ACR/ASA/SCA/SCAI/SIR/STS/SVM Guidelines for the Diagnosis and Management of Patients with Thoracic Aortic Disease. Circulation. 2010; 121: S287-G811 4. Pulmonary artery enlargement suggests pulmonary arterial hypertension. 5.  Atherosclerosis, including within the coronary arteries. 6. Upper pole left renal lesion which is technically indeterminate. Most likely a minimally complex cyst. Given the dominant brain mass, this is of questionable clinical significance. Ultrasound followup at 6 months could be performed to confirm size stability.    Electronically Signed   By: Abigail Miyamoto M.D.   On: 11/10/2014 12:33   Mr Jeri Cos Contrast  11/10/2014   CLINICAL DATA:  Breast cancer.  Seizures.  EXAM: MRI HEAD WITH CONTRAST  TECHNIQUE: Multiplanar, multiecho pulse sequences of the brain and surrounding structures were obtained with intravenous contrast.  COMPARISON:  None.  CONTRAST:  45mL MULTIHANCE GADOBENATE DIMEGLUMINE 529 MG/ML IV SOLN  FINDINGS: Post infusion, there is a solitary intra-axial mass lesion, RIGHT posterior temporal lobe, measuring 34 x 50 x 40 mm cross-section (R-L x A-P x C-C). Marked surrounding vasogenic edema. The mass is sufficiently large as to be both RIGHT atrial periventricular as well as abutting the dura laterally and transverse sinus inferiorly. There are variable degrees of central necrosis but overall the mass shows avid enhancement. No other similar lesions are seen in the brain on this motion degraded scan. Despite moderate edema, no significant midline shift.  IMPRESSION: Solitary 34 x 50 x 40 mm intra-axial RIGHT posterior temporal mass lesion, avidly enhancing, with areas of central necrosis.  Solitary breast cancer metastasis is favored. Other intra-axial tumors, such as GBM or lymphoma are less likely. The imaging appearance is not strongly suggestive of brain abscess, but cannot completely be excluded given the heterogeneous diffusion restriction on prior non con MR, and the presence of Gram positive cocci bacteremia. Tissue sampling may be warranted.   Electronically Signed   By: Staci Righter M.D.   On: 11/10/2014 11:13    Management plans discussed with the patient, family and they are in agreement.  CODE STATUS: Full code  TOTAL TIME TAKING CARE OF THIS PATIENT: 55 minutes.    [facility], Antinio Sanderfer M.D on 11/11/2014 at 12:28 PM  Between 7am to 6pm - Pager - [phone]  After 6pm go to [email or web address] - password [email or web address] [facility]  [facility]  Office  [phone]  CC: Primary care physician;  Adrian Prows, MD Leotis Pain, MD Forest Gleason, MD Algernon Huxley, MD

## 2014-11-11 NOTE — Care Management Important Message (Signed)
Important Message  Patient Details  Name: SAVAHNA CASADOS MRN: [id number] Date of Birth: 1929/12/05   Medicare Important Message Given:  Yes-third notification given    Juliann Pulse A Allmond 11/11/2014, 10:04 AM

## 2014-11-11 NOTE — Progress Notes (Signed)
ANTIBIOTIC CONSULT NOTE - FOLLOW UP   Pharmacy Consult for Vancomycin  Indication: sepsis  Allergies  Allergen Reactions  . Penicillins Rash    Patient Measurements: Height: 5\' 8"  (172.7 cm) Weight: 221 lb 5.5 oz (100.4 kg) IBW/kg (Calculated) : 63.9 Adjusted Body Weight: 78.5 kg   Vital Signs: Temp: 97.5 F (36.4 C) (08/17 0605) Temp Source: Oral (08/17 0605) BP: 129/55 mmHg (08/17 0605) Pulse Rate: 66 (08/17 0605) Intake/Output from previous day: 08/16 0701 - 08/17 0700 In: 490 [P.O.:240; IV Piggyback:250] Out: -  Intake/Output from this shift:    Labs:  Recent Labs  11/08/14 0854 11/10/14 0522  WBC 15.5* 7.2  HGB 14.5 13.0  PLT 269 212  CREATININE 0.95 0.73   Estimated Creatinine Clearance: 66 mL/min (by C-G formula based on Cr of 0.73).  Recent Labs  11/11/14 0434  [place]     Microbiology: Recent Results (from the past 720 hour(s))  Blood culture (routine x 2)     Status: None   Collection Time: 11/02/14  6:21 PM  Result Value Ref Range Status   Specimen Description BLOOD RIGHT HAND  Final   Special Requests BAA,5ML,ANA,AER  Final   Culture NO GROWTH 7 DAYS  Final   Report Status 11/09/2014 FINAL  Final  Blood culture (routine x 2)     Status: None   Collection Time: 11/02/14  6:21 PM  Result Value Ref Range Status   Specimen Description BLOOD LEFT ARM  Final   Special Requests BAA,10ML,ANA,AER  Final   Culture NO GROWTH 7 DAYS  Final   Report Status 11/09/2014 FINAL  Final  Urine culture     Status: None   Collection Time: 11/02/14  6:21 PM  Result Value Ref Range Status   Specimen Description URINE, RANDOM  Final   Special Requests Normal  Final   Culture   Final    >=100,000 COLONIES/mL PROTEUS MIRABILIS >=100,000 COLONIES/mL ESCHERICHIA COLI    Report Status 11/06/2014 FINAL  Final   Organism ID, Bacteria PROTEUS MIRABILIS  Final   Organism ID, Bacteria ESCHERICHIA COLI  Final      Susceptibility   Escherichia coli - MIC*     AMPICILLIN 4 SENSITIVE Sensitive     CEFTAZIDIME <=1 SENSITIVE Sensitive     CEFAZOLIN <=4 SENSITIVE Sensitive     CEFTRIAXONE <=1 SENSITIVE Sensitive     CIPROFLOXACIN <=0.25 SENSITIVE Sensitive     GENTAMICIN <=1 SENSITIVE Sensitive     IMIPENEM <=0.25 SENSITIVE Sensitive     TRIMETH/SULFA <=20 SENSITIVE Sensitive     NITROFURANTOIN Value in next row Sensitive      SENSITIVE<=16    PIP/TAZO Value in next row Sensitive      SENSITIVE<=4    LEVOFLOXACIN Value in next row Sensitive      SENSITIVE<=0.12    * >=100,000 COLONIES/mL ESCHERICHIA COLI   Proteus mirabilis - MIC*    AMPICILLIN Value in next row Sensitive      SENSITIVE<=0.12    CEFTAZIDIME Value in next row Sensitive      SENSITIVE<=0.12    CEFAZOLIN Value in next row Sensitive      SENSITIVE<=0.12    CEFTRIAXONE Value in next row Sensitive      SENSITIVE<=0.12    CIPROFLOXACIN Value in next row Sensitive      SENSITIVE<=0.12    GENTAMICIN Value in next row Sensitive      SENSITIVE<=0.12    IMIPENEM Value in next row Sensitive  SENSITIVE<=0.12    TRIMETH/SULFA Value in next row Sensitive      SENSITIVE<=0.12    NITROFURANTOIN Value in next row Resistant      RESISTANT128    PIP/TAZO Value in next row Sensitive      SENSITIVE<=4    AMPICILLIN/SULBACTAM Value in next row Sensitive      SENSITIVE<=2    * >=100,000 COLONIES/mL PROTEUS MIRABILIS  Blood Culture (routine x 2)     Status: None   Collection Time: 11/08/14  8:55 AM  Result Value Ref Range Status   Specimen Description BLOOD RIGHT ARM  Final   Special Requests   Final    BOTTLES DRAWN AEROBIC AND ANAEROBIC  AER 5CC ANA 2CC   Culture  Setup Time   Final    GRAM POSITIVE COCCI IN CLUSTERS IN BOTH AEROBIC AND ANAEROBIC BOTTLES CRITICAL RESULT CALLED TO, READ BACK BY AND VERIFIED WITH: ERIN PARDINI AT [place] 11/09/14 [facility] CRITICAL VALUE NOTED.  VALUE IS CONSISTENT WITH PREVIOUSLY REPORTED AND CALLED VALUE.    Culture   Final    STAPHYLOCOCCUS  EPIDERMIDIS IN BOTH AEROBIC AND ANAEROBIC BOTTLES    Report Status 11/11/2014 FINAL  Final   Organism ID, Bacteria STAPHYLOCOCCUS EPIDERMIDIS  Final      Susceptibility   Staphylococcus epidermidis - MIC*    CIPROFLOXACIN >=8 RESISTANT Resistant     ERYTHROMYCIN >=8 RESISTANT Resistant     GENTAMICIN <=0.5 SENSITIVE Sensitive     OXACILLIN Value in next row Resistant      >=4 RESISTANTWARNING: For oxacillin-resistant S.aureus and coagulase-negative staphylococci (MRS), other beta-lactam agents, ie, penicillins, beta-lactam/beta-lactamase inhibitor combinations, cephems (with the exception of the cephalosporins with anti-MRSA activity), and carbapenems, may appear active in vitro, but are not effective clinically.  --CLSI, Vol.32 No.3, January 2012, pg 70.    TETRACYCLINE Value in next row Sensitive      >=4 RESISTANTWARNING: For oxacillin-resistant S.aureus and coagulase-negative staphylococci (MRS), other beta-lactam agents, ie, penicillins, beta-lactam/beta-lactamase inhibitor combinations, cephems (with the exception of the cephalosporins with anti-MRSA activity), and carbapenems, may appear active in vitro, but are not effective clinically.  --CLSI, Vol.32 No.3, January 2012, pg 70.    VANCOMYCIN Value in next row Sensitive      >=4 RESISTANTWARNING: For oxacillin-resistant S.aureus and coagulase-negative staphylococci (MRS), other beta-lactam agents, ie, penicillins, beta-lactam/beta-lactamase inhibitor combinations, cephems (with the exception of the cephalosporins with anti-MRSA activity), and carbapenems, may appear active in vitro, but are not effective clinically.  --CLSI, Vol.32 No.3, January 2012, pg 70.    CLINDAMYCIN Value in next row Resistant      >=4 RESISTANTWARNING: For oxacillin-resistant S.aureus and coagulase-negative staphylococci (MRS), other beta-lactam agents, ie, penicillins, beta-lactam/beta-lactamase inhibitor combinations, cephems (with the exception of the  cephalosporins with anti-MRSA activity), and carbapenems, may appear active in vitro, but are not effective clinically.  --CLSI, Vol.32 No.3, January 2012, pg 70.    * STAPHYLOCOCCUS EPIDERMIDIS  Urine culture     Status: None   Collection Time: 11/08/14  8:56 AM  Result Value Ref Range Status   Specimen Description URINE, RANDOM  Final   Special Requests Normal  Final   Culture MULTIPLE SPECIES PRESENT, SUGGEST RECOLLECTION  Final   Report Status 11/09/2014 FINAL  Final  Blood Culture (routine x 2)     Status: None (Preliminary result)   Collection Time: 11/08/14  9:00 AM  Result Value Ref Range Status   Specimen Description BLOOD LEFT FATTY CASTS  Final   Special Requests  Final    BOTTLES DRAWN AEROBIC AND ANAEROBIC  AER 4CC ANA 1CC   Culture  Setup Time   Final    GRAM POSITIVE COCCI IN CLUSTERS AEROBIC BOTTLE ONLY CRITICAL RESULT CALLED TO, READ BACK BY AND VERIFIED WITH: ERIN PARDINI AT 0820 11/09/14 CTJ    Culture   Final    COAGULASE NEGATIVE STAPHYLOCOCCUS AEROBIC BOTTLE ONLY Results consistent with contamination. NOT THE SAME COAGULASE NEGATIVE STAPHYLOCOCCUS AS THE OTHER SET    Report Status PENDING  Incomplete  Culture, blood (routine x 2)     Status: None (Preliminary result)   Collection Time: 11/09/14 10:18 PM  Result Value Ref Range Status   Specimen Description BLOOD LEFT HAND  Final   Special Requests   Final    BOTTLES DRAWN AEROBIC AND ANAEROBIC 5MLANAEROBIC, 8MLAEROBIC   Culture NO GROWTH < 12 HOURS  Final   Report Status PENDING  Incomplete  Culture, blood (routine x 2)     Status: None (Preliminary result)   Collection Time: 11/09/14 10:25 PM  Result Value Ref Range Status   Specimen Description BLOOD RIGHT HAND  Final   Special Requests BOTTLES DRAWN AEROBIC AND ANAEROBIC 5ML  Final   Culture NO GROWTH < 12 HOURS  Final   Report Status PENDING  Incomplete    Medical History: Past Medical History  Diagnosis Date  . Breast cancer 2015    positive  w/ radiation  . Hypertension   . Depression   . Anxiety   . Sleep apnea   . Hypothyroidism   . Gout of right foot 10/21/14  . Gout of right foot   . Hypercholesterolemia 10/21/14  . Diabetes mellitus without complication     Medications:  Scheduled:  . allopurinol  300 mg Oral Daily  . antiseptic oral rinse  7 mL Mouth Rinse BID  . aspirin  300 mg Rectal Daily  . atorvastatin  10 mg Oral q1800  . cefTRIAXone (ROCEPHIN)  IV  1 g Intravenous Q24H  . cholecalciferol  2,000 Units Oral Daily  . dexamethasone  10 mg Intravenous Q12H  . FLUoxetine  40 mg Oral Daily  . heparin  5,000 Units Subcutaneous 3 times per day  . levETIRAcetam  750 mg Oral BID  . levothyroxine  200 mcg Oral QAC breakfast  . losartan  25 mg Oral Daily  . multivitamin with minerals  1 tablet Oral Daily  . pantoprazole  40 mg Oral QHS  . [START ON 11/12/2014] vancomycin  1,500 mg Intravenous Q18H   Assessment: Pharmacy consulted to dose vancomycin and levaquin for sepsis in this 81 year old female. Blood cultures with GPCs  Goal of Therapy:  Vancomycin trough level 15-20 mcg/ml  Plan:   Kel (hr-1): 0.05 Half-life (hrs): 14h Vd (liters): 55L  1. Patient previously started on vancomycin 1250 IV Q18H dose missed/discontinued overnight. Will resume dosing this morning with stacked dosing. Will give vancomycin 1250mg  IV x 1 now and follow with vancomycin 1250 IV Q18H to start in 10 hours. Will check trough prior to third dose which should be approaching steady state.   8/17: Vancomycin trough level resulted @ 17 mcg/ml. Level was drawn almost 3  hours early. Will change to Vancomycin 1500 mg IV q18 hours since true trough would've likely resulted in a subtherapeutic trough level . Will order another Vancomycin trough level prior to the 07:00 dose on 8/20.   Pharmacy to follow per consult  Larene Beach, PharmD Clinical Pharmacist  11/11/2014,8:05  AM

## 2014-11-13 LAB — CULTURE, BLOOD (ROUTINE X 2)

## 2014-11-14 LAB — CULTURE, BLOOD (ROUTINE X 2)
CULTURE: NO GROWTH
CULTURE: NO GROWTH

## 2014-11-25 ENCOUNTER — Encounter: Payer: Self-pay | Admitting: Radiation Oncology

## 2014-11-25 ENCOUNTER — Encounter: Payer: Self-pay | Admitting: Oncology

## 2014-11-25 ENCOUNTER — Inpatient Hospital Stay: Payer: Medicare (Managed Care) | Attending: Oncology | Admitting: Oncology

## 2014-11-25 ENCOUNTER — Ambulatory Visit
Admission: RE | Admit: 2014-11-25 | Discharge: 2014-11-25 | Disposition: A | Discharge status: Home / Self Care | Payer: Medicare Other | Source: Ambulatory Visit | Attending: Radiation Oncology | Admitting: Radiation Oncology

## 2014-11-25 VITALS — BP 130/100 | HR 89 | Temp 95.6°F | Wt 222.0 lb

## 2014-11-25 DIAGNOSIS — E119 Type 2 diabetes mellitus without complications: Secondary | ICD-10-CM

## 2014-11-25 DIAGNOSIS — Z853 Personal history of malignant neoplasm of breast: Secondary | ICD-10-CM

## 2014-11-25 DIAGNOSIS — N63 Unspecified lump in breast: Secondary | ICD-10-CM

## 2014-11-25 DIAGNOSIS — C7931 Secondary malignant neoplasm of brain: Secondary | ICD-10-CM

## 2014-11-25 DIAGNOSIS — I10 Essential (primary) hypertension: Secondary | ICD-10-CM

## 2014-11-25 NOTE — Progress Notes (Signed)
Patient does not have living will.  Former smoker. 

## 2014-11-25 NOTE — Progress Notes (Signed)
Radiation Oncology Follow up Note  Name: Allison Gonzalez   Date:   11/25/2014 MRN:  [id number] DOB: September 21, 1929    This 81 y.o. female presents to the clinic today for evaluation of brain metastasis.  REFERRING PROVIDER: Adrian Prows, MD  HPI: Patient is a 81 year old female well known to our department having been diagnosed with stage I breast cancer back in February 2015 status post wide local excision and radiation therapy followed by letrozole. She was seen in the emergency room August 14 with complaints of being found unconscious. MRI scan of her brain showed a right temporal mass measuring approximately 5 cm in greatest dimension most consistent with brain metastasis. She also multiple areas of subacute infarct. Patient has not undergone any biopsy attempt. She is extremely poor performance status and is almost unresponsive on examination today. She is seen today accompanied by her 2 sons with discussion on proceeding with any type of treatment at this point..  COMPLICATIONS OF TREATMENT: none  FOLLOW UP COMPLIANCE: keeps appointments   PHYSICAL EXAM:  There were no vitals taken for this visit. Well-developed lethargic female unresponsive to any verbal commands. Physical examination is extremely difficult. Lungs are clear to A&P cardiac examination shows regular rate and rhythm. Abdomen is benign. Impossible to determine neurologic examination at this time based on the patient's overall clinical condition.  RADIOLOGY RESULTS: MRI scans and CT scans are reviewed  PLAN: I discussed the case both with medical oncology and the patient's 2 sons. We are favoring observing her at this time and repeating her scans about a month to determine progression of disease in her brain and to evaluate her overall general condition. She certainly may be a hospice candidate at this time in the both sons are aware of that. I agree she will be extremely difficult to treat at this time unable to cooperate  with her treatments and running a high risk of injury during treatment from being high up in our treatment table. I will reevaluate her spine follow-up scans when they become available. Follow-up with radiation oncology will be left to the medical oncology team.  I would like to take this opportunity to participate in this unfortunate patient's care.    Armstead Peaks., MD

## 2014-11-30 NOTE — Progress Notes (Signed)
Patient Name Sex DOB SSN   Allison Gonzalez, Allison Gonzalez Female 30-Apr-1929 [id number]                        [facility] @ [facility] [phone] [phone]   I  Therese Sarah OB: 07/07/1929 MR#: [id number]  Patient Care Team: Clydie Braun, MD as PCP - General (Infectious Diseases)  CHIEF COMPLAINT:  Chief Complaint  Patient presents with  . Recurrent UTI   1. Admitted in the hospital  being unconscious for last few days. MRI scan shows a right temporal mass Multiple area of infarct , VISIT DIAGNOSIS:    ICD-9-CM ICD-10-CM   1. Acute CVA (cerebrovascular accident) 434.91 I63.9 dexamethasone (DECADRON) injection 10 mg     levETIRAcetam (KEPPRA) tablet 750 mg  2. Sepsis, due to unspecified organism 038.9 A41.9    995.91    3. Complicated UTI (urinary tract infection) 599.0 N39.0   4. Unresponsive state 780.09 R40.4   5. Stroke 434.91 I63.9 US Carotid Bilateral     US Carotid Bilateral     dexamethasone (DECADRON) injection 10 mg     levETIRAcetam (KEPPRA) tablet 750 mg  6. Cancer 199.1 C80.1 CT Chest W Contrast     CT Chest W Contrast  7. Brain mass 348.9 G93.9 MR Brain W Contrast     MR Brain W Contrast     CANCELED: MR Brain W Contrast     CANCELED: MR Brain W Contrast     CANCELED: MR Brain W Wo Contrast     CANCELED: MR Brain W Wo Contrast      No history exists.    Oncology Flowsheet 11/02/2014 11/03/2014 11/04/2014 11/05/2014 11/09/2014 11/09/2014 11/10/2014  dexamethasone (DECADRON) IV - - - - 10 mg 10 mg 10 mg  enoxaparin (LOVENOX) [place] 40 mg 40 mg 40 mg - - - -  letrozole (FEMARA) PO - 2.5 mg - 2.5 mg - - -    INTERVAL HISTORY: Patient has been diagnosed to have carcinoma breast in February 6 of 2015 invasive mammary carcinoma and ductal carcinoma in situ ER positive PR positive initially was  staged as T1b N0 M0 stage I disease patient had lumpectomy and radiation therapy and was started on letrozole.. Patient presented to emergency room on 14th of August with complaints of being unconscious for a few days. No witnessed seizure activity. Patient is very poor historian. So most of the history has been off pain reviewing chart. There was some mention about patient getting ECT on July 24 and has been canceled. Patient does not remember actually what happened to her. Trying to talk but not able to express herself.  Patient underwent CT scan of chest abdomen pelvis also MRI scan of brain. Also had down Doppler study of both carotid artery but left carotid artery was not visualized. CT scan and MRI scan revealed multiple area of subacute infarct. MRI scan revealed right temporal lobe mass I was asked to evaluate patient at that point in time. IMPRESSION: Solitary 34 x 50 x 40 mm intra-axial RIGHT posterior temporal mass lesion, avidly enhancing, with areas of central necrosis.  Solitary breast cancer metastasis is favored. Other intra-axial tumors, such as GBM or lymphoma are less likely. The imaging appearance is not strongly suggestive of brain abscess, but cannot completely be excluded given the heterogeneous diffusion restriction on prior non con MR, and the presence of Gram positive cocci bacteremia. Tissue sampling may be warranted.  November 25, 2014 Patient and family came today further follow-up regarding brain mass.  Also the recent CT scan reveals possibility of right breast mass.  Patient was admitted in rehabilitation and during 2 weeks.  Patient's condition has not changed.  Patient is responding sporadically.  Poor performance status.  Spending most of the time in the bed. Family present REVIEW OF SYSTEMS:   general status: Patient is feeling weak and tired.  No change in a performance status.  No chills.  No fever. Performance status is 2 HEENT: No evidence of  stomatitis Lungs: No cough or shortness of breath Cardiac: No chest pain or paroxysmal nocturnal dyspnea GI: No nausea no vomiting no diarrhea no abdominal pain Skin: No rash Lower extremity no swelling Neurological system: Patient is not very communicative.  He has expressive aphasia Musculoskeletal system no bony pains   PAST MEDICAL HISTORY: Past Medical History  Diagnosis Date  . Breast cancer 2015    positive w/ radiation  . Hypertension   . Depression   . Anxiety   . Sleep apnea   . Hypothyroidism   . Gout of right foot 10/21/14  . Gout of right foot   . Hypercholesterolemia 10/21/14  . Diabetes mellitus without complication     PAST SURGICAL HISTORY: Past Surgical History  Procedure Laterality Date  . Eye surgery    . Breast surgery    . Hernia repair Right 10/21/14    FAMILY HISTORY Family History  Problem Relation Age of Onset  . Hypertension Son   . CVA Mother   . Heart disease Father       ADVANCED DIRECTIVES:   Not available, I will discuss with the family HEALTH MAINTENANCE: Social History  Substance Use Topics  . Smoking status: Former Smoker    Types: Cigarettes    Quit date: 10/21/1974  . Smokeless tobacco: None  . Alcohol Use: No      Allergies  Allergen Reactions  . Penicillins Rash    Current Facility-Administered Medications  Medication Dose Route Frequency Provider Last Rate Last Dose  . 0.9 % sodium chloride infusion  Intravenous Continuous Demetrios Loll, MD 50 mL/hr at 11/09/14 1857   . allopurinol (ZYLOPRIM) tablet 300 mg 300 mg Oral Daily Max Sane, MD  300 mg at 11/10/14 1110  . antiseptic oral rinse (CPC / CETYLPYRIDINIUM CHLORIDE 0.05%) solution 7 mL 7 mL Mouth Rinse BID Vela Prose Pardini, RN  7 mL at 11/10/14 1111  . aspirin suppository 300 mg 300 mg Rectal Daily Demetrios Loll, MD   300 mg at 11/10/14 1110  . atorvastatin (LIPITOR) tablet 10 mg 10 mg Oral q1800 Max Sane, MD  10 mg at 11/09/14 1800  . cefTRIAXone (ROCEPHIN) 1 g in dextrose 5 % 50 mL IVPB 1 g Intravenous Q24H Adrian Prows, MD  1 g at 11/09/14 2313  . cholecalciferol (VITAMIN D) tablet 2,000 Units 2,000 Units Oral Daily Max Sane, MD  2,000 Units at 11/10/14 1110  . dexamethasone (DECADRON) injection 10 mg 10 mg Intravenous Q12H Forest Gleason, MD  10 mg at 11/10/14 1111  . docusate sodium (COLACE) capsule 100 mg 100 mg Oral BID PRN Max Sane, MD    . FLUoxetine (PROZAC) capsule 40 mg 40 mg Oral Daily Vipul Shah, MD  40 mg at 11/10/14 1110  . heparin injection 5,000 Units 5,000 Units Subcutaneous 3 times per day Demetrios Loll, MD  5,000 Units at 11/10/14 1335  . levETIRAcetam (KEPPRA) tablet 750 mg 750 mg Oral BID Delorise Shiner  Alexys Lobello, MD  750 mg at 11/10/14 1110  . levothyroxine (SYNTHROID, LEVOTHROID) tablet 200 mcg 200 mcg Oral QAC breakfast Max Sane, MD  200 mcg at 11/10/14 0731  . losartan (COZAAR) tablet 25 mg 25 mg Oral Daily Max Sane, MD  25 mg at 11/10/14 1110  . multivitamin with minerals tablet 1 tablet 1 tablet Oral Daily Max Sane, MD  1 tablet at 11/10/14 1110  . pantoprazole (PROTONIX) EC tablet 40 mg 40 mg Oral QHS Max Sane, MD  40 mg at 11/09/14 2314  . senna-docusate (Senokot-S) tablet 1 tablet 1 tablet Oral QHS PRN Demetrios Loll, MD    . vancomycin (VANCOCIN) 1,250 mg in sodium chloride 0.9 % 250 mL IVPB 1,250 mg Intravenous Q18H Max Sane, MD  1,250 mg at 11/10/14 1335    OBJECTIVE: PHYSICAL EXAM Patient is lying in the bed. Has increasing difficulty with expressing herself.  Right breast shows some induration at the site of the previous lumpectomy. Neurological system difficult to evaluate. Some weakness on the right side Speech is  impaired Abdominal exam revealed normal bowel sounds. The abdomen was soft, non-tender, and without masses, organomegaly, or appreciable enlargement of the abdominal aorta. Examination of the skin revealed no evidence of significant rashes, suspicious appearing nevi or other concerning lesions. Cardiac exam revealed the PMI to be normally situated and sized. The rhythm was regular and no extrasystoles were noted during several minutes of auscultation. The first and second heart sounds were normal and physiologic splitting of the second heart sound was noted. There were no murmurs, rubs, clicks, or gallops. Examination of the chest was unremarkable. There were no bony deformities, no asymmetry, and no other abnormalities. Lower extremity no edema,  Blood pressure is 1 30 / 100 pulse 89  Weight is 222 pounds               Resp:    Body mass index is 33.66 kg/(m^2). ECOG FS:2 - Symptomatic, <50% confined to bed  LAB RESULTS:  Admission on 11/08/2014  Component Date Value Ref Range Status  . Lactic Acid, Venous 11/08/2014 1.3  0.5 - 2.0 mmol/L Final  . Sodium 11/08/2014 139  135 - 145 mmol/L Final  . Potassium 11/08/2014 4.6  3.5 - 5.1 mmol/L Final  . Chloride 11/08/2014 102  101 - 111 mmol/L Final  . CO2 11/08/2014 26  22 - 32 mmol/L Final  . Glucose, Bld 11/08/2014 104* 65 - 99 mg/dL Final  . BUN 11/08/2014 19  6 - 20 mg/dL Final  . Creatinine, Ser 11/08/2014 0.95  0.44 - 1.00 mg/dL Final  . Calcium 11/08/2014 10.0  8.9 - 10.3 mg/dL Final  . Total Protein 11/08/2014 7.6  6.5 - 8.1 g/dL Final  . Albumin 11/08/2014 3.6  3.5 - 5.0 g/dL Final  . AST 11/08/2014 38  15 - 41 U/L Final  . ALT 11/08/2014 23  14 - 54 U/L Final  . Alkaline Phosphatase 11/08/2014 122  38 - 126 U/L Final  . Total Bilirubin 11/08/2014 0.4  0.3 - 1.2 mg/dL Final  . GFR calc non Af Amer 11/08/2014 54* >60  mL/min Final  . GFR calc Af Amer 11/08/2014 >60  >60 mL/min Final   Comment: (NOTE) The eGFR has been calculated using the CKD EPI equation. This calculation has not been validated in all clinical situations. eGFR's persistently <60 mL/min signify possible Chronic Kidney Disease.   . Anion gap 11/08/2014 11  5 - 15 Final  . Lipase 11/08/2014 14*  22 - 51 U/L Final  . Troponin I 11/08/2014 <0.03  <0.031 ng/mL Final   Comment:  NO INDICATION OF MYOCARDIAL INJURY.   . WBC 11/08/2014 15.5* 3.6 - 11.0 K/uL Final  . RBC 11/08/2014 5.03  3.80 - 5.20 MIL/uL Final  . Hemoglobin 11/08/2014 14.5  12.0 - 16.0 g/dL Final  . HCT 11/08/2014 45.5  35.0 - 47.0 % Final  . MCV 11/08/2014 90.5  80.0 - 100.0 fL Final  . MCH 11/08/2014 28.9  26.0 - 34.0 pg Final  . MCHC 11/08/2014 31.9* 32.0 - 36.0 g/dL Final  . RDW 11/08/2014 15.7* 11.5 - 14.5 % Final  . Platelets 11/08/2014 269  150 - 440 K/uL Final  . Neutrophils Relative % 11/08/2014 68   Final  . Neutro Abs 11/08/2014 10.6* 1.4 - 6.5 K/uL Final  . Lymphocytes Relative 11/08/2014 24   Final  . Lymphs Abs 11/08/2014 3.6  1.0 - 3.6 K/uL Final  . Monocytes Relative 11/08/2014 6   Final  . Monocytes Absolute 11/08/2014 0.9  0.2 - 0.9 K/uL Final  . Eosinophils Relative 11/08/2014 1   Final  . Eosinophils Absolute 11/08/2014 0.1  0 - 0.7 K/uL Final  . Basophils Relative 11/08/2014 1   Final  . Basophils Absolute 11/08/2014 0.1  0 - 0.1 K/uL Final  . aPTT 11/08/2014 29  24 - 36 seconds Final  . Prothrombin Time 11/08/2014 14.2  11.4 - 15.0 seconds Final  . INR 11/08/2014 1.08   Final  . Specimen Description 11/08/2014 BLOOD RIGHT ARM   Final  . Special Requests 11/08/2014 BOTTLES DRAWN AEROBIC AND ANAEROBIC AER 5CC ANA 2CC   Final  . Culture Setup Time 11/08/2014    Final   Value:GRAM POSITIVE COCCI IN CLUSTERS IN BOTH AEROBIC AND ANAEROBIC BOTTLES CRITICAL RESULT CALLED TO, READ BACK BY AND VERIFIED WITH: ERIN PARDINI AT 0820 11/09/14 [facility] CRITICAL VALUE NOTED. VALUE IS CONSISTENT WITH PREVIOUSLY REPORTED AND CALLED VALUE.   . Culture 11/08/2014   Final   Value:COAGULASE NEGATIVE STAPHYLOCOCCUS IN BOTH AEROBIC AND ANAEROBIC BOTTLES IDENTIFICATION TO FOLLOW   . Report Status 11/08/2014 PENDING   Incomplete  . Specimen Description 11/08/2014 BLOOD LEFT FATTY CASTS   Final      Objective Gen. status: Patient is in wheelchair. Higher functions difficult to evaluate neurological system weakness on the right side expressive aphagia. Examination of breasts continued to showed induration in the right breast (breast is free of masses Abdomen: Soft.  Liver and spleen not palpable Lymphatic system no palpable lymphadenopathy Lower extremity trace edema Skin: No rash Performance status is 2. Impression MRI scan of the brain has been reviewed again and reviewed with the patient's family there is a right temporal mass which suggestive of metastatic gastric disease however primary tumor cannot be ruled out Possibility of cerebrovascular accident History of weakness seizure activity during last hospitalization Very poor progress with rehabilitation therapy Patient was seen by radiation oncologist Prolonged discussion with family regarding A. neurosurgical opinion B. radiation therapy C. no further treatment and palliative therapy considering patient's age, poor performance status, cerebrovascular accident as well as coronary artery disease D. A showing does not have advanced healthcare directive and family was encouraged to discuss Family does not want to take an aggressive approach but would like to wait and see how situation involves a want to repeat MRI scan repeated.  They understand neurosurgical  option is available to them.  According to radiation oncologist radiation may be difficult as patient does  not understand and follow the instruction. Continue anti-SEIZURE  medication Total duration of visit was 45 minutes.  50% or more time was spent in counseling patient and family regarding prognosis and options of treatment and available resources

## 2014-12-02 NOTE — Addendum Note (Signed)
Encounter addended by: Kathyrn Drown, RN on: 12/02/2014  4:15 PM<BR>     Documentation filed: PRL Based Order Sets, Orders

## 2014-12-03 ENCOUNTER — Inpatient Hospital Stay: Payer: Medicare Other | Admitting: Oncology

## 2014-12-03 ENCOUNTER — Inpatient Hospital Stay: Payer: Medicare Other

## 2014-12-16 ENCOUNTER — Telehealth (HOSPITAL_COMMUNITY): Payer: Self-pay | Admitting: *Deleted

## 2014-12-16 NOTE — Telephone Encounter (Signed)
Checking insurance for retro authorization of ECT. No authorization required.

## 2014-12-23 ENCOUNTER — Ambulatory Visit: Payer: Medicare Other

## 2014-12-24 ENCOUNTER — Inpatient Hospital Stay: Payer: Medicare Other | Admitting: Oncology

## 2014-12-26 DEATH — deceased

## 2015-09-20 IMAGING — CT CT CHEST W/ CM
1 of 2 series · 14 of 31 positions shown, 18 images · IV contrast (omnipaque)
Comparison: Chest radiograph of 11/08/2014.  No prior chest CT.

CLINICAL DATA: Altered mental status. Brain and breast masses.
Bacteremia.

EXAM:
CT CHEST WITH CONTRAST
TECHNIQUE: Multidetector CT imaging of the chest was performed during
intravenous contrast administration.
CONTRAST:  75mL OMNIPAQUE IOHEXOL 300 MG/ML  SOLN

[Series 2: routine chest with · axial · 0.73mm/px · z∈[-980,-726]mm · 14 of 61 slices shown, 18 images]
[im 5/61  mediastinal]
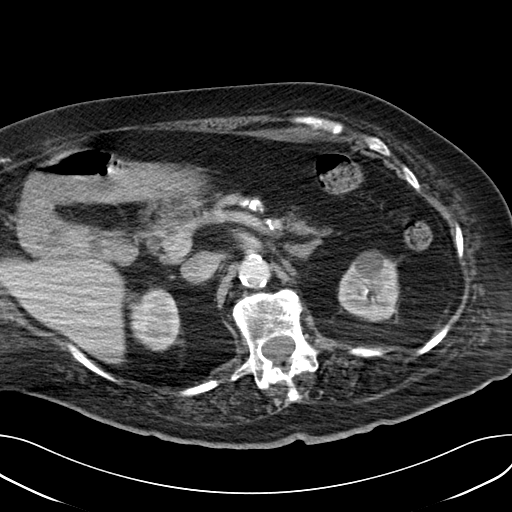
[im 5/61  lung]
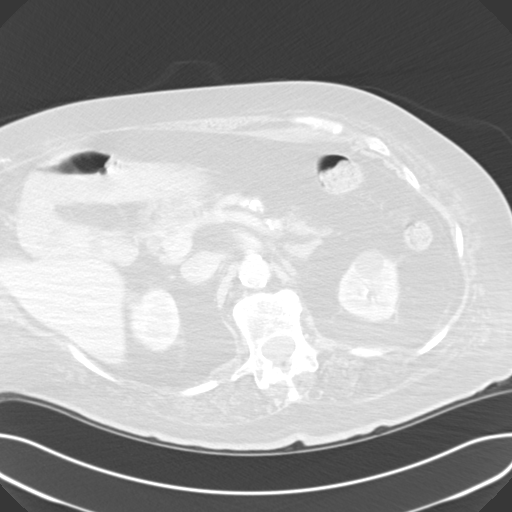
[im 10/61  lung]
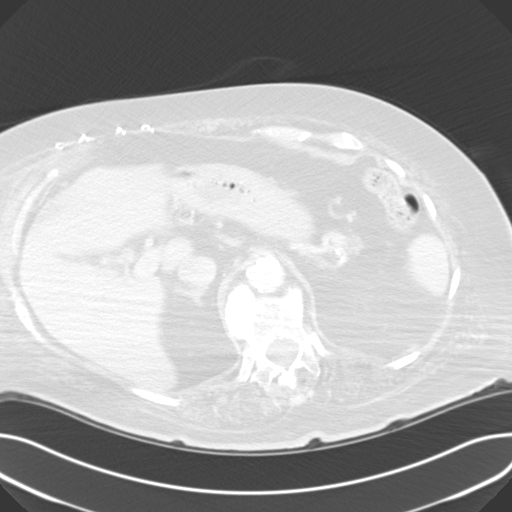
[im 14/61  lung]
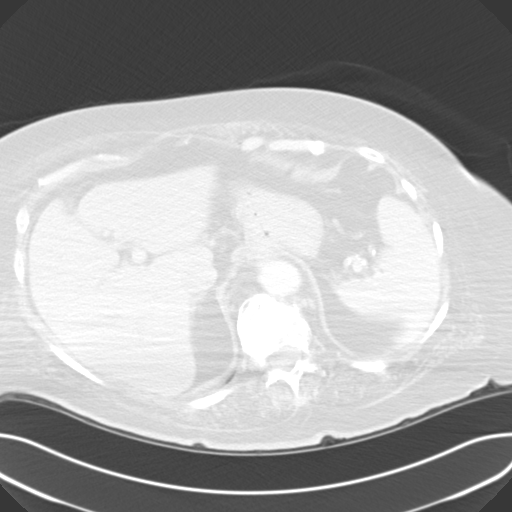
[im 19/61  lung]
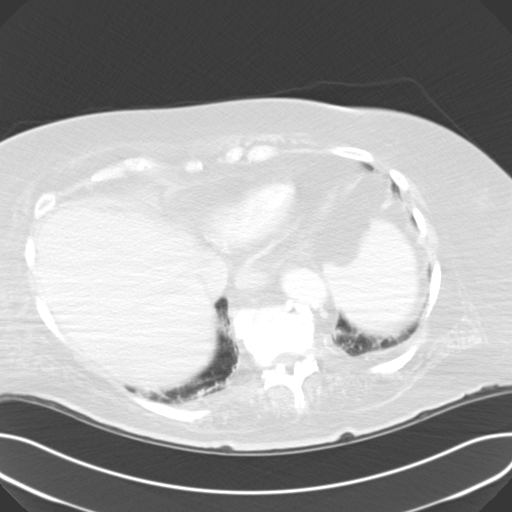
[im 24/61  mediastinal]
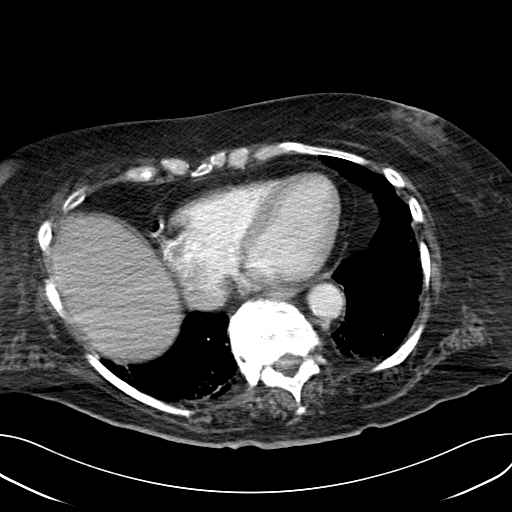
[im 24/61  lung]
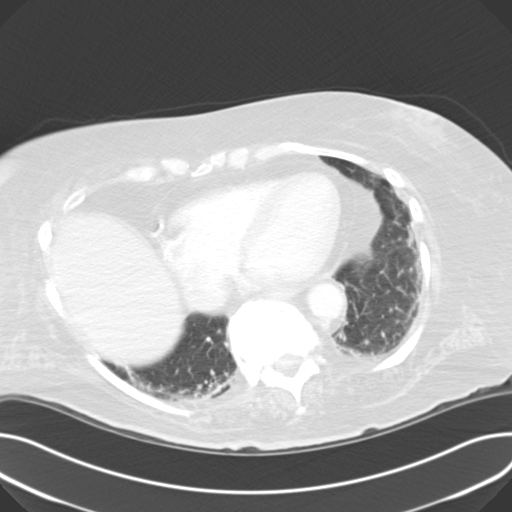
[im 28/61  lung]
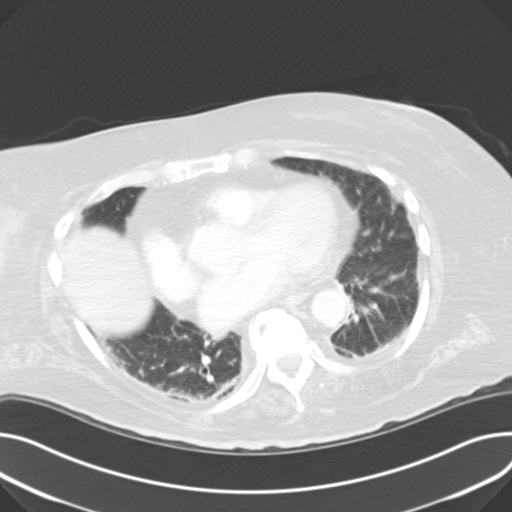
[im 29/61  lung]
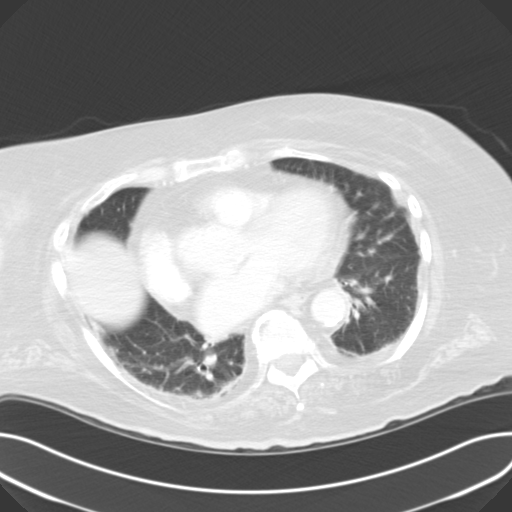
[im 31/61  lung]
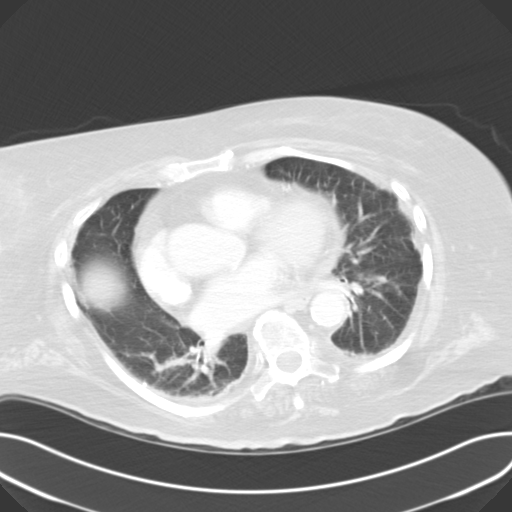
[im 33/61  mediastinal]
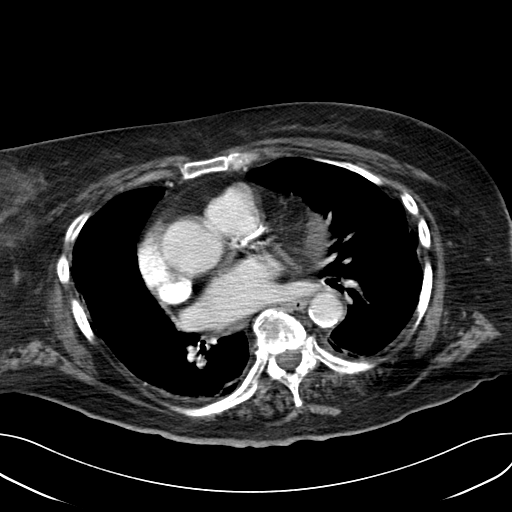
[im 33/61  lung]
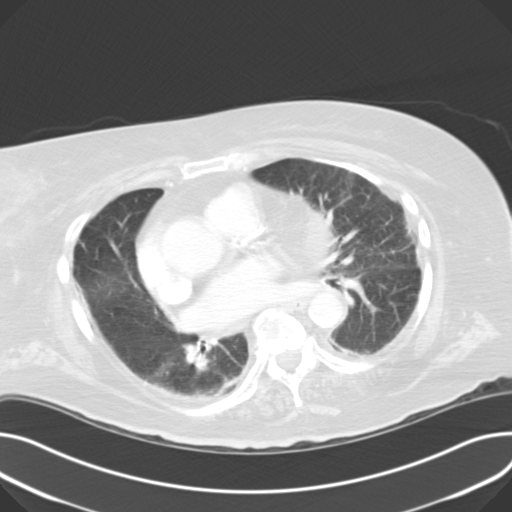
[im 37/61  lung]
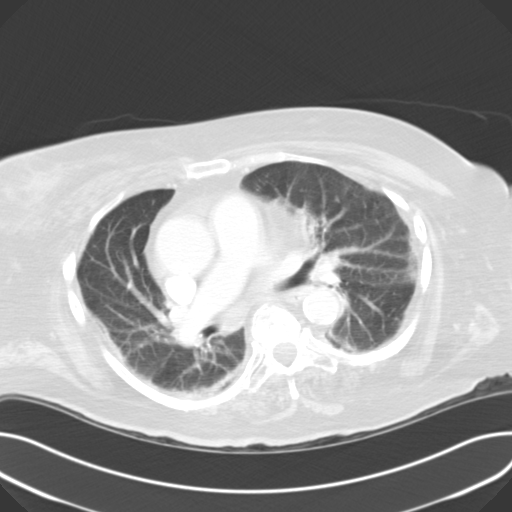
[im 42/61  lung]
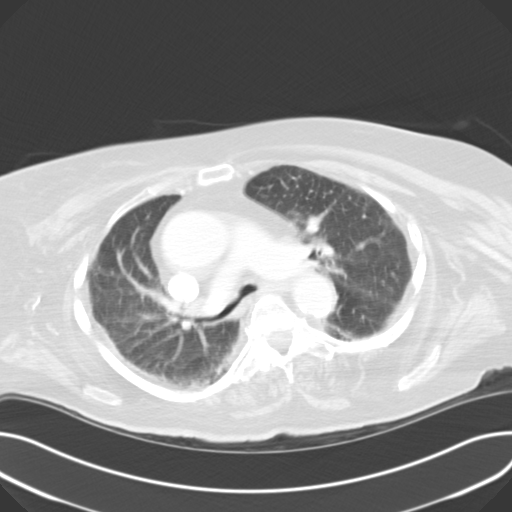
[im 47/61  lung]
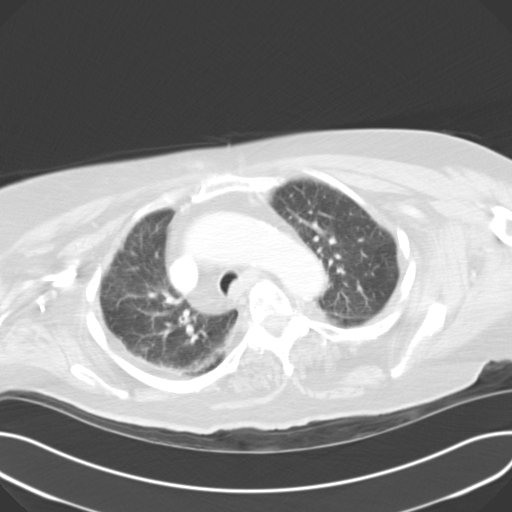
[im 51/61  mediastinal]
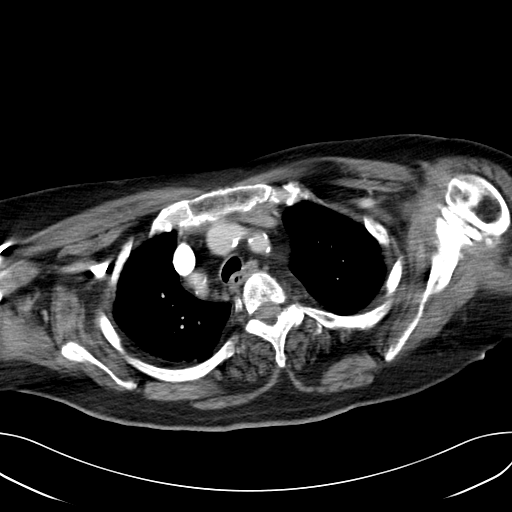
[im 51/61  lung]
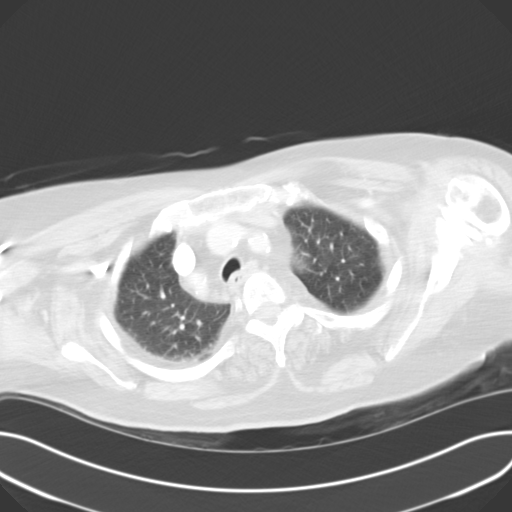
[im 56/61  lung]
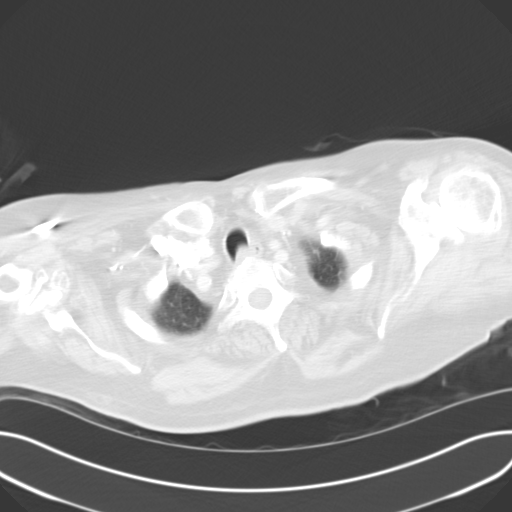

[14 of 31 positions shown; findings below may reference images not displayed]

FINDINGS: Mediastinum/Nodes: Exclusion of the previously described right
breast Mass. No axillary adenopathy. Tortuous thoracic aorta. Mild
dilatation of the ascending segment at maximally 4.1 cm. Example
coronal image 58. Moderate cardiomegaly with mild lipomatous
hypertrophy of the interatrial septum. Multivessel coronary artery
atherosclerosis. Pulmonary artery enlargement. No central pulmonary
embolism, on this non-dedicated study. No mediastinal or hilar
adenopathy. A small hiatal hernia. No internal mammary adenopathy.

Lungs/Pleura: Mild degradation secondary to motion and patient arm
position, not raised above the head. Mild bilateral pleural
thickening, without significant pleural fluid.

No dominant pulmonary mass or lobar consolidation.

Upper abdomen: Normal imaged portions of the liver, spleen, adrenal
glands. Mild renal cortical thinning. Upper pole left renal
low-density lesion at 1.8 cm. Twenty-five HU today versus 17 HU on
11/08/2014. Abdominal aortic and branch vessel atherosclerosis.
Early contrast excretion into both renal collecting systems.

Musculoskeletal: Advanced bilateral glenohumeral joint
osteoarthritis. Heterogeneous marrow density, primarily felt to be
due to osteopenia. Mild T5 superior endplate irregularity and
sclerosis.
IMPRESSION: 1. Position and motion degraded exam.
2. Given this factor, no evidence of primary neoplasm, metastatic
disease, or infection within the chest.
3. Aortic tortuosity with mild dilatation of the ascending aorta.
Recommend annual imaging followup by CTA or MRA. This recommendation
follows 2242 ACCF/AHA/AATS/ACR/ASA/SCA/[place]/AR[place] Guidelines
for the Diagnosis and Management of Patients with Thoracic Aortic
Disease. Circulation. 2242; 121: [id number]
4. Pulmonary artery enlargement suggests pulmonary arterial
hypertension.
5.  Atherosclerosis, including within the coronary arteries.
6. Upper pole left renal lesion which is technically indeterminate.
Most likely a minimally complex cyst. Given the dominant brain mass,
this is of questionable clinical significance. Ultrasound followup
at 6 months could be performed to confirm size stability.
